# Patient Record
Sex: Female | Born: 1990
Health system: Southern US, Community
[De-identification: ages and names within clinical notes are randomized; demographics above are authoritative.]

## PROBLEM LIST (undated history)

## (undated) ENCOUNTER — Inpatient Hospital Stay (HOSPITAL_COMMUNITY): Payer: Self-pay

## (undated) DIAGNOSIS — I10 Essential (primary) hypertension: Secondary | ICD-10-CM

## (undated) DIAGNOSIS — R8271 Bacteriuria: Secondary | ICD-10-CM

## (undated) DIAGNOSIS — O9989 Other specified diseases and conditions complicating pregnancy, childbirth and the puerperium: Secondary | ICD-10-CM

## (undated) HISTORY — DX: Other specified diseases and conditions complicating pregnancy, childbirth and the puerperium: O99.89

## (undated) HISTORY — DX: Bacteriuria: R82.71

## (undated) HISTORY — PX: TYMPANOPLASTY: SHX33

---
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
## 2008-06-14 ENCOUNTER — Emergency Department: Payer: Self-pay | Admitting: Emergency Medicine

## 2012-12-14 ENCOUNTER — Encounter: Payer: Self-pay | Admitting: *Deleted

## 2012-12-14 ENCOUNTER — Ambulatory Visit (INDEPENDENT_AMBULATORY_CARE_PROVIDER_SITE_OTHER): Payer: Medicaid Other | Admitting: *Deleted

## 2012-12-14 VITALS — BP 129/88 | Ht <= 58 in | Wt 123.0 lb

## 2012-12-14 DIAGNOSIS — Z3400 Encounter for supervision of normal first pregnancy, unspecified trimester: Secondary | ICD-10-CM

## 2012-12-14 DIAGNOSIS — N912 Amenorrhea, unspecified: Secondary | ICD-10-CM

## 2012-12-14 DIAGNOSIS — Z3401 Encounter for supervision of normal first pregnancy, first trimester: Secondary | ICD-10-CM

## 2012-12-14 NOTE — Progress Notes (Signed)
Patient is here today for her New OB nurse visit.  She is doing well and is only [redacted] weeks along today.  She will return to the office in 3 weeks for bloodwork coand an ultrasound to confirm fetal viability.  She is currently taking prenatal vitamins and will continue with this.  She will see the physician at her next visit.

## 2012-12-14 NOTE — Patient Instructions (Signed)
Pregnancy - First Trimester  During sexual intercourse, millions of sperm go into the vagina. Only 1 sperm will penetrate and fertilize the female egg while it is in the Fallopian tube. One week later, the fertilized egg implants into the wall of the uterus. An embryo begins to develop into a baby. At 6 to 8 weeks, the eyes and face are formed and the heartbeat can be seen on ultrasound. At the end of 12 weeks (first trimester), all the baby's organs are formed. Now that you are pregnant, you will want to do everything you can to have a healthy baby. Two of the most important things are to get good prenatal care and follow your caregiver's instructions. Prenatal care is all the medical care you receive before the baby's birth. It is given to prevent, find, and treat problems during the pregnancy and childbirth.  PRENATAL EXAMS  · During prenatal visits, your weight, blood pressure, and urine are checked. This is done to make sure you are healthy and progressing normally during the pregnancy.  · A pregnant woman should gain 25 to 35 pounds during the pregnancy. However, if you are overweight or underweight, your caregiver will advise you regarding your weight.  · Your caregiver will ask and answer questions for you.  · Blood work, cervical cultures, other necessary tests, and a Pap test are done during your prenatal exams. These tests are done to check on your health and the probable health of your baby. Tests are strongly recommended and done for HIV with your permission. This is the virus that causes AIDS. These tests are done because medicines can be given to help prevent your baby from being born with this infection should you have been infected without knowing it. Blood work is also used to find out your blood type, previous infections, and follow your blood levels (hemoglobin).  · Low hemoglobin (anemia) is common during pregnancy. Iron and vitamins are given to help prevent this. Later in the pregnancy, blood  tests for diabetes will be done along with any other tests if any problems develop.  · You may need other tests to make sure you and the baby are doing well.  CHANGES DURING THE FIRST TRIMESTER   Your body goes through many changes during pregnancy. They vary from person to person. Talk to your caregiver about changes you notice and are concerned about. Changes can include:  · Your menstrual period stops.  · The egg and sperm carry the genes that determine what you look like. Genes from you and your partner are forming a baby. The female genes determine whether the baby is a boy or a girl.  · Your body increases in girth and you may feel bloated.  · Feeling sick to your stomach (nauseous) and throwing up (vomiting). If the vomiting is uncontrollable, call your caregiver.  · Your breasts will begin to enlarge and become tender.  · Your nipples may stick out more and become darker.  · The need to urinate more. Painful urination may mean you have a bladder infection.  · Tiring easily.  · Loss of appetite.  · Cravings for certain kinds of food.  · At first, you may gain or lose a couple of pounds.  · You may have changes in your emotions from day to day (excited to be pregnant or concerned something may go wrong with the pregnancy and baby).  · You may have more vivid and strange dreams.  HOME CARE INSTRUCTIONS   ·   It is very important to avoid all smoking, alcohol and non-prescribed drugs during your pregnancy. These affect the formation and growth of the baby. Avoid chemicals while pregnant to ensure the delivery of a healthy infant.  · Start your prenatal visits by the 12th week of pregnancy. They are usually scheduled monthly at first, then more often in the last 2 months before delivery. Keep your caregiver's appointments. Follow your caregiver's instructions regarding medicine use, blood and lab tests, exercise, and diet.  · During pregnancy, you are providing food for you and your baby. Eat regular, well-balanced  meals. Choose foods such as meat, fish, milk and other low fat dairy products, vegetables, fruits, and whole-grain breads and cereals. Your caregiver will tell you of the ideal weight gain.  · You can help morning sickness by keeping soda crackers at the bedside. Eat a couple before arising in the morning. You may want to use the crackers without salt on them.  · Eating 4 to 5 small meals rather than 3 large meals a day also may help the nausea and vomiting.  · Drinking liquids between meals instead of during meals also seems to help nausea and vomiting.  · A physical sexual relationship may be continued throughout pregnancy if there are no other problems. Problems may be early (premature) leaking of amniotic fluid from the membranes, vaginal bleeding, or belly (abdominal) pain.  · Exercise regularly if there are no restrictions. Check with your caregiver or physical therapist if you are unsure of the safety of some of your exercises. Greater weight gain will occur in the last 2 trimesters of pregnancy. Exercising will help:  · Control your weight.  · Keep you in shape.  · Prepare you for labor and delivery.  · Help you lose your pregnancy weight after you deliver your baby.  · Wear a good support or jogging bra for breast tenderness during pregnancy. This may help if worn during sleep too.  · Ask when prenatal classes are available. Begin classes when they are offered.  · Do not use hot tubs, steam rooms, or saunas.  · Wear your seat belt when driving. This protects you and your baby if you are in an accident.  · Avoid raw meat, uncooked cheese, cat litter boxes, and soil used by cats throughout the pregnancy. These carry germs that can cause birth defects in the baby.  · The first trimester is a good time to visit your dentist for your dental health. Getting your teeth cleaned is okay. Use a softer toothbrush and brush gently during pregnancy.  · Ask for help if you have financial, counseling, or nutritional needs  during pregnancy. Your caregiver will be able to offer counseling for these needs as well as refer you for other special needs.  · Do not take any medicines or herbs unless told by your caregiver.  · Inform your caregiver if there is any mental or physical domestic violence.  · Make a list of emergency phone numbers of family, friends, hospital, and police and fire departments.  · Write down your questions. Take them to your prenatal visit.  · Do not douche.  · Do not cross your legs.  · If you have to stand for long periods of time, rotate you feet or take small steps in a circle.  · You may have more vaginal secretions that may require a sanitary pad. Do not use tampons or scented sanitary pads.  MEDICINES AND DRUG USE IN PREGNANCY  ·   Take prenatal vitamins as directed. The vitamin should contain 1 milligram of folic acid. Keep all vitamins out of reach of children. Only a couple vitamins or tablets containing iron may be fatal to a baby or young child when ingested.  · Avoid use of all medicines, including herbs, over-the-counter medicines, not prescribed or suggested by your caregiver. Only take over-the-counter or prescription medicines for pain, discomfort, or fever as directed by your caregiver. Do not use aspirin, ibuprofen, or naproxen unless directed by your caregiver.  · Let your caregiver also know about herbs you may be using.  · Alcohol is related to a number of birth defects. This includes fetal alcohol syndrome. All alcohol, in any form, should be avoided completely. Smoking will cause low birth rate and premature babies.  · Street or illegal drugs are very harmful to the baby. They are absolutely forbidden. A baby born to an addicted mother will be addicted at birth. The baby will go through the same withdrawal an adult does.  · Let your caregiver know about any medicines that you have to take and for what reason you take them.  SEEK MEDICAL CARE IF:   You have any concerns or worries during your  pregnancy. It is better to call with your questions if you feel they cannot wait, rather than worry about them.  SEEK IMMEDIATE MEDICAL CARE IF:   · An unexplained oral temperature above 102° F (38.9° C) develops, or as your caregiver suggests.  · You have leaking of fluid from the vagina (birth canal). If leaking membranes are suspected, take your temperature and inform your caregiver of this when you call.  · There is vaginal spotting or bleeding. Notify your caregiver of the amount and how many pads are used.  · You develop a bad smelling vaginal discharge with a change in the color.  · You continue to feel sick to your stomach (nauseated) and have no relief from remedies suggested. You vomit blood or coffee ground-like materials.  · You lose more than 2 pounds of weight in 1 week.  · You gain more than 2 pounds of weight in 1 week and you notice swelling of your face, hands, feet, or legs.  · You gain 5 pounds or more in 1 week (even if you do not have swelling of your hands, face, legs, or feet).  · You get exposed to German measles and have never had them.  · You are exposed to fifth disease or chickenpox.  · You develop belly (abdominal) pain. Round ligament discomfort is a common non-cancerous (benign) cause of abdominal pain in pregnancy. Your caregiver still must evaluate this.  · You develop headache, fever, diarrhea, pain with urination, or shortness of breath.  · You fall or are in a car accident or have any kind of trauma.  · There is mental or physical violence in your home.  Document Released: 03/17/2001 Document Revised: 12/16/2011 Document Reviewed: 09/18/2008  [employer]® Patient Information ©2014 [employer].

## 2013-01-04 ENCOUNTER — Ambulatory Visit (INDEPENDENT_AMBULATORY_CARE_PROVIDER_SITE_OTHER): Payer: Medicaid Other | Admitting: Obstetrics and Gynecology

## 2013-01-04 ENCOUNTER — Other Ambulatory Visit (HOSPITAL_COMMUNITY)
Admission: RE | Admit: 2013-01-04 | Discharge: 2013-01-04 | Disposition: A | Discharge status: Home / Self Care | Payer: Medicaid Other | Source: Ambulatory Visit | Attending: Obstetrics and Gynecology | Admitting: Obstetrics and Gynecology

## 2013-01-04 ENCOUNTER — Encounter: Payer: Self-pay | Admitting: Obstetrics and Gynecology

## 2013-01-04 VITALS — BP 94/85 | Wt 122.0 lb

## 2013-01-04 DIAGNOSIS — Z3400 Encounter for supervision of normal first pregnancy, unspecified trimester: Secondary | ICD-10-CM

## 2013-01-04 DIAGNOSIS — Z3401 Encounter for supervision of normal first pregnancy, first trimester: Secondary | ICD-10-CM

## 2013-01-04 DIAGNOSIS — Z01419 Encounter for gynecological examination (general) (routine) without abnormal findings: Secondary | ICD-10-CM | POA: Insufficient documentation

## 2013-01-04 DIAGNOSIS — Z113 Encounter for screening for infections with a predominantly sexual mode of transmission: Secondary | ICD-10-CM | POA: Insufficient documentation

## 2013-01-04 NOTE — Patient Instructions (Signed)
Pregnancy - First Trimester During sexual intercourse, millions of sperm go into the vagina. Only 1 sperm will penetrate and fertilize the female egg while it is in the Fallopian tube. One week later, the fertilized egg implants into the wall of the uterus. An embryo begins to develop into a baby. At 6 to 8 weeks, the eyes and face are formed and the heartbeat can be seen on ultrasound. At the end of 12 weeks (first trimester), all the baby's organs are formed. Now that you are pregnant, you will want to do everything you can to have a healthy baby. Two of the most important things are to get good prenatal care and follow your caregiver's instructions. Prenatal care is all the medical care you receive before the baby's birth. It is given to prevent, find, and treat problems during the pregnancy and childbirth. PRENATAL EXAMS  During prenatal visits, your weight, blood pressure, and urine are checked. This is done to make sure you are healthy and progressing normally during the pregnancy.  A pregnant woman should gain 25 to 35 pounds during the pregnancy. However, if you are overweight or underweight, your caregiver will advise you regarding your weight.  Your caregiver will ask and answer questions for you.  Blood work, cervical cultures, other necessary tests, and a Pap test are done during your prenatal exams. These tests are done to check on your health and the probable health of your baby. Tests are strongly recommended and done for HIV with your permission. This is the virus that causes AIDS. These tests are done because medicines can be given to help prevent your baby from being born with this infection should you have been infected without knowing it. Blood work is also used to find out your blood type, previous infections, and follow your blood levels (hemoglobin).  Low hemoglobin (anemia) is common during pregnancy. Iron and vitamins are given to help prevent this. Later in the pregnancy,  blood tests for diabetes will be done along with any other tests if any problems develop.  You may need other tests to make sure you and the baby are doing well. CHANGES DURING THE FIRST TRIMESTER  Your body goes through many changes during pregnancy. They vary from person to person. Talk to your caregiver about changes you notice and are concerned about. Changes can include:  Your menstrual period stops.  The egg and sperm carry the genes that determine what you look like. Genes from you and your partner are forming a baby. The female genes determine whether the baby is a boy or a girl.  Your body increases in girth and you may feel bloated.  Feeling sick to your stomach (nauseous) and throwing up (vomiting). If the vomiting is uncontrollable, call your caregiver.  Your breasts will begin to enlarge and become tender.  Your nipples may stick out more and become darker.  The need to urinate more. Painful urination may mean you have a bladder infection.  Tiring easily.  Loss of appetite.  Cravings for certain kinds of food.  At first, you may gain or lose a couple of pounds.  You may have changes in your emotions from day to day (excited to be pregnant or concerned something may go wrong with the pregnancy and baby).  You may have more vivid and strange dreams. HOME CARE INSTRUCTIONS   It is very important to avoid all smoking, alcohol and non-prescribed drugs during your pregnancy. These affect the formation and growth of the baby.   Avoid chemicals while pregnant to ensure the delivery of a healthy infant.  Start your prenatal visits by the 12th week of pregnancy. They are usually scheduled monthly at first, then more often in the last 2 months before delivery. Keep your caregiver's appointments. Follow your caregiver's instructions regarding medicine use, blood and lab tests, exercise, and diet.  During pregnancy, you are providing food for you and your baby. Eat regular,  well-balanced meals. Choose foods such as meat, fish, milk and other low fat dairy products, vegetables, fruits, and whole-grain breads and cereals. Your caregiver will tell you of the ideal weight gain.  You can help morning sickness by keeping soda crackers at the bedside. Eat a couple before arising in the morning. You may want to use the crackers without salt on them.  Eating 4 to 5 small meals rather than 3 large meals a day also may help the nausea and vomiting.  Drinking liquids between meals instead of during meals also seems to help nausea and vomiting.  A physical sexual relationship may be continued throughout pregnancy if there are no other problems. Problems may be early (premature) leaking of amniotic fluid from the membranes, vaginal bleeding, or belly (abdominal) pain.  Exercise regularly if there are no restrictions. Check with your caregiver or physical therapist if you are unsure of the safety of some of your exercises. Greater weight gain will occur in the last 2 trimesters of pregnancy. Exercising will help:  Control your weight.  Keep you in shape.  Prepare you for labor and delivery.  Help you lose your pregnancy weight after you deliver your baby.  Wear a good support or jogging bra for breast tenderness during pregnancy. This may help if worn during sleep too.  Ask when prenatal classes are available. Begin classes when they are offered.  Do not use hot tubs, steam rooms, or saunas.  Wear your seat belt when driving. This protects you and your baby if you are in an accident.  Avoid raw meat, uncooked cheese, cat litter boxes, and soil used by cats throughout the pregnancy. These carry germs that can cause birth defects in the baby.  The first trimester is a good time to visit your dentist for your dental health. Getting your teeth cleaned is okay. Use a softer toothbrush and brush gently during pregnancy.  Ask for help if you have financial, counseling, or  nutritional needs during pregnancy. Your caregiver will be able to offer counseling for these needs as well as refer you for other special needs.  Do not take any medicines or herbs unless told by your caregiver.  Inform your caregiver if there is any mental or physical domestic violence.  Make a list of emergency phone numbers of family, friends, hospital, and police and fire departments.  Write down your questions. Take them to your prenatal visit.  Do not douche.  Do not cross your legs.  If you have to stand for long periods of time, rotate you feet or take small steps in a circle.  You may have more vaginal secretions that may require a sanitary pad. Do not use tampons or scented sanitary pads. MEDICINES AND DRUG USE IN PREGNANCY  Take prenatal vitamins as directed. The vitamin should contain 1 milligram of folic acid. Keep all vitamins out of reach of children. Only a couple vitamins or tablets containing iron may be fatal to a baby or young child when ingested.  Avoid use of all medicines, including herbs, over-the-counter medicines, not   prescribed or suggested by your caregiver. Only take over-the-counter or prescription medicines for pain, discomfort, or fever as directed by your caregiver. Do not use aspirin, ibuprofen, or naproxen unless directed by your caregiver.  Let your caregiver also know about herbs you may be using.  Alcohol is related to a number of birth defects. This includes fetal alcohol syndrome. All alcohol, in any form, should be avoided completely. Smoking will cause low birth rate and premature babies.  Street or illegal drugs are very harmful to the baby. They are absolutely forbidden. A baby born to an addicted mother will be addicted at birth. The baby will go through the same withdrawal an adult does.  Let your caregiver know about any medicines that you have to take and for what reason you take them. SEEK MEDICAL CARE IF:  You have any concerns or  worries during your pregnancy. It is better to call with your questions if you feel they cannot wait, rather than worry about them. SEEK IMMEDIATE MEDICAL CARE IF:   An unexplained oral temperature above 102 F (38.9 C) develops, or as your caregiver suggests.  You have leaking of fluid from the vagina (birth canal). If leaking membranes are suspected, take your temperature and inform your caregiver of this when you call.  There is vaginal spotting or bleeding. Notify your caregiver of the amount and how many pads are used.  You develop a bad smelling vaginal discharge with a change in the color.  You continue to feel sick to your stomach (nauseated) and have no relief from remedies suggested. You vomit blood or coffee ground-like materials.  You lose more than 2 pounds of weight in 1 week.  You gain more than 2 pounds of weight in 1 week and you notice swelling of your face, hands, feet, or legs.  You gain 5 pounds or more in 1 week (even if you do not have swelling of your hands, face, legs, or feet).  You get exposed to German measles and have never had them.  You are exposed to fifth disease or chickenpox.  You develop belly (abdominal) pain. Round ligament discomfort is a common non-cancerous (benign) cause of abdominal pain in pregnancy. Your caregiver still must evaluate this.  You develop headache, fever, diarrhea, pain with urination, or shortness of breath.  You fall or are in a car accident or have any kind of trauma.  There is mental or physical violence in your home. Document Released: 03/17/2001 Document Revised: 12/16/2011 Document Reviewed: 09/18/2008 [employer] Patient Information 2014 [employer].  Contraception Choices Contraception (birth control) is the use of any methods or devices to prevent pregnancy. Below are some methods to help avoid pregnancy. HORMONAL METHODS   Contraceptive implant. This is a thin, plastic tube containing progesterone hormone. It  does not contain estrogen hormone. Your caregiver inserts the tube in the inner part of the upper arm. The tube can remain in place for up to 3 years. After 3 years, the implant must be removed. The implant prevents the ovaries from releasing an egg (ovulation), thickens the cervical mucus which prevents sperm from entering the uterus, and thins the lining of the inside of the uterus.  Progesterone-only injections. These injections are given every 3 months by your caregiver to prevent pregnancy. This synthetic progesterone hormone stops the ovaries from releasing eggs. It also thickens cervical mucus and changes the uterine lining. This makes it harder for sperm to survive in the uterus.  Birth control pills. These pills   contain estrogen and progesterone hormone. They work by stopping the egg from forming in the ovary (ovulation). Birth control pills are prescribed by a caregiver.Birth control pills can also be used to treat heavy periods.  Minipill. This type of birth control pill contains only the progesterone hormone. They are taken every day of each month and must be prescribed by your caregiver.  Birth control patch. The patch contains hormones similar to those in birth control pills. It must be changed once a week and is prescribed by a caregiver.  Vaginal ring. The ring contains hormones similar to those in birth control pills. It is left in the vagina for 3 weeks, removed for 1 week, and then a new one is put back in place. The patient must be comfortable inserting and removing the ring from the vagina.A caregiver's prescription is necessary.  Emergency contraception. Emergency contraceptives prevent pregnancy after unprotected sexual intercourse. This pill can be taken right after sex or up to 5 days after unprotected sex. It is most effective the sooner you take the pills after having sexual intercourse. Emergency contraceptive pills are available without a prescription. Check with your  pharmacist. Do not use emergency contraception as your only form of birth control. BARRIER METHODS   Female condom. This is a thin sheath (latex or rubber) that is worn over the penis during sexual intercourse. It can be used with spermicide to increase effectiveness.  Female condom. This is a soft, loose-fitting sheath that is put into the vagina before sexual intercourse.  Diaphragm. This is a soft, latex, dome-shaped barrier that must be fitted by a caregiver. It is inserted into the vagina, along with a spermicidal jelly. It is inserted before intercourse. The diaphragm should be left in the vagina for 6 to 8 hours after intercourse.  Cervical cap. This is a round, soft, latex or plastic cup that fits over the cervix and must be fitted by a caregiver. The cap can be left in place for up to 48 hours after intercourse.  Sponge. This is a soft, circular piece of polyurethane foam. The sponge has spermicide in it. It is inserted into the vagina after wetting it and before sexual intercourse.  Spermicides. These are chemicals that kill or block sperm from entering the cervix and uterus. They come in the form of creams, jellies, suppositories, foam, or tablets. They do not require a prescription. They are inserted into the vagina with an applicator before having sexual intercourse. The process must be repeated every time you have sexual intercourse. INTRAUTERINE CONTRACEPTION  Intrauterine device (IUD). This is a T-shaped device that is put in a woman's uterus during a menstrual period to prevent pregnancy. There are 2 types:  Copper IUD. This type of IUD is wrapped in copper wire and is placed inside the uterus. Copper makes the uterus and fallopian tubes produce a fluid that kills sperm. It can stay in place for 10 years.  Hormone IUD. This type of IUD contains the hormone progestin (synthetic progesterone). The hormone thickens the cervical mucus and prevents sperm from entering the uterus, and it  also thins the uterine lining to prevent implantation of a fertilized egg. The hormone can weaken or kill the sperm that get into the uterus. It can stay in place for 5 years. PERMANENT METHODS OF CONTRACEPTION  Female tubal ligation. This is when the woman's fallopian tubes are surgically sealed, tied, or blocked to prevent the egg from traveling to the uterus.  Female sterilization. This is when   the female has the tubes that carry sperm tied off (vasectomy).This blocks sperm from entering the vagina during sexual intercourse. After the procedure, the man can still ejaculate fluid (semen). NATURAL PLANNING METHODS  Natural family planning. This is not having sexual intercourse or using a barrier method (condom, diaphragm, cervical cap) on days the woman could become pregnant.  Calendar method. This is keeping track of the length of each menstrual cycle and identifying when you are fertile.  Ovulation method. This is avoiding sexual intercourse during ovulation.  Symptothermal method. This is avoiding sexual intercourse during ovulation, using a thermometer and ovulation symptoms.  Post-ovulation method. This is timing sexual intercourse after you have ovulated. Regardless of which type or method of contraception you choose, it is important that you use condoms to protect against the transmission of sexually transmitted diseases (STDs). Talk with your caregiver about which form of contraception is most appropriate for you. Document Released: 03/23/2005 Document Revised: 06/15/2011 Document Reviewed: 07/30/2010 [employer] Patient Information 2014 [employer].  Breastfeeding A change in hormones during your pregnancy causes growth of your breast tissue and an increase in number and size of milk ducts. The hormone prolactin allows proteins, sugars, and fats from your blood supply to make breast milk in your milk-producing glands. The hormone progesterone prevents breast milk from being released  before the birth of your baby. After the birth of your baby, your progesterone level decreases allowing breast milk to be released. Thoughts of your baby, as well as his or her sucking or crying, can stimulate the release of milk from the milk-producing glands. Deciding to breastfeed (nurse) is one of the best choices you can make for you and your baby. The information that follows gives a brief review of the benefits, as well as other important skills to know about breastfeeding. BENEFITS OF BREASTFEEDING For your baby  The first milk (colostrum) helps your baby's digestive system function better.   There are antibodies in your milk that help your baby fight off infections.   Your baby has a lower incidence of asthma, allergies, and sudden infant death syndrome (SIDS).   The nutrients in breast milk are better for your baby than infant formulas.  Breast milk improves your baby's brain development.   Your baby will have less gas, colic, and constipation.  Your baby is less likely to develop other conditions, such as childhood obesity, asthma, or diabetes mellitus. For you  Breastfeeding helps develop a very special bond between you and your baby.   Breastfeeding is convenient, always available at the correct temperature, and costs nothing.   Breastfeeding helps to burn calories and helps you lose the weight gained during pregnancy.   Breastfeeding makes your uterus contract back down to normal size faster and slows bleeding following delivery.   Breastfeeding mothers have a lower risk of developing osteoporosis or breast or ovarian cancer later in life.  BREASTFEEDING FREQUENCY  A healthy, full-term baby may breastfeed as often as every hour or space his or her feedings to every 3 hours. Breastfeeding frequency will vary from baby to baby.   Newborns should be fed no less than every 2 3 hours during the day and every 4 5 hours during the night. You should breastfeed a  minimum of 8 feedings in a 24 hour period.  Awaken your baby to breastfeed if it has been 3 4 hours since the last feeding.  Breastfeed when you feel the need to reduce the fullness of your breasts or when   your newborn shows signs of hunger. Signs that your baby may be hungry include:  Increased alertness or activity.  Stretching.  Movement of the head from side to side.  Movement of the head and opening of the mouth when the corner of the mouth or cheek is stroked (rooting).  Increased sucking sounds, smacking lips, cooing, sighing, or squeaking.  Hand-to-mouth movements.  Increased sucking of fingers or hands.  Fussing.  Intermittent crying.  Signs of extreme hunger will require calming and consoling before you try to feed your baby. Signs of extreme hunger may include:  Restlessness.  A loud, strong cry.  Screaming.  Frequent feeding will help you make more milk and will help prevent problems, such as sore nipples and engorgement of the breasts.  BREASTFEEDING   Whether lying down or sitting, be sure that the baby's abdomen is facing your abdomen.   Support your breast with 4 fingers under your breast and your thumb above your nipple. Make sure your fingers are well away from your nipple and your baby's mouth.   Stroke your baby's lips gently with your finger or nipple.   When your baby's mouth is open wide enough, place all of your nipple and as much of the colored area around your nipple (areola) as possible into your baby's mouth.  More areola should be visible above his or her upper lip than below his or her lower lip.  Your baby's tongue should be between his or her lower gum and your breast.  Ensure that your baby's mouth is correctly positioned around the nipple (latched). Your baby's lips should create a seal on your breast.  Signs that your baby has effectively latched onto your nipple include:  Tugging or sucking without pain.  Swallowing heard  between sucks.  Absent click or smacking sound.  Muscle movement above and in front of his or her ears with sucking.  Your baby must suck about 2 3 minutes in order to get your milk. Allow your baby to feed on each breast as long as he or she wants. Nurse your baby until he or she unlatches or falls asleep at the first breast, then offer the second breast.  Signs that your baby is full and satisfied include:  A gradual decrease in the number of sucks or complete cessation of sucking.  Falling asleep.  Extension or relaxation of his or her body.  Retention of a small amount of milk in his or her mouth.  Letting go of your breast by himself or herself.  Signs of effective breastfeeding in you include:  Breasts that have increased firmness, weight, and size prior to feeding.  Breasts that are softer after nursing.  Increased milk volume, as well as a change in milk consistency and color by the 5th day of breastfeeding.  Breast fullness relieved by breastfeeding.  Nipples are not sore, cracked, or bleeding.  If needed, break the suction by putting your finger into the corner of your baby's mouth and sliding your finger between his or her gums. Then, remove your breast from his or her mouth.  It is common for babies to spit up a small amount after a feeding.  Babies often swallow air during feeding. This can make babies fussy. Burping your baby between breasts can help with this.  Vitamin D supplements are recommended for babies who get only breast milk.  Avoid using a pacifier during your baby's first 4 6 weeks.  Avoid supplemental feedings of water, formula, or   juice in place of breastfeeding. Breast milk is all the food your baby needs. It is not necessary for your baby to have water or formula. Your breasts will make more milk if supplemental feedings are avoided during the early weeks. HOW TO TELL WHETHER YOUR BABY IS GETTING ENOUGH BREAST MILK Wondering whether or not  your baby is getting enough milk is a common concern among mothers. You can be assured that your baby is getting enough milk if:   Your baby is actively sucking and you hear swallowing.   Your baby seems relaxed and satisfied after a feeding.   Your baby nurses at least 8 12 times in a 24 hour time period.  During the first 3 5 days of age:  Your baby is wetting at least 3 5 diapers in a 24 hour period. The urine should be clear and pale yellow.  Your baby is having at least 3 4 stools in a 24 hour period. The stool should be soft and yellow.  At 5 7 days of age, your baby is having at least 3 6 stools in a 24 hour period. The stool should be seedy and yellow by 5 days of age.  Your baby has a weight loss less than 7 10% during the first 3 days of age.  Your baby does not lose weight after 3 7 days of age.  Your baby gains 4 7 ounces each week after he or she is 4 days of age.  Your baby gains weight by 5 days of age and is back to birth weight within 2 weeks. ENGORGEMENT In the first week after your baby is born, you may experience extremely full breasts (engorgement). When engorged, your breasts may feel heavy, warm, or tender to the touch. Engorgement peaks within 24 48 hours after delivery of your baby.  Engorgement may be reduced by:  Continuing to breastfeed.  Increasing the frequency of breastfeeding.  Taking warm showers or applying warm, moist heat to your breasts just before each feeding. This increases circulation and helps the milk flow.   Gently massaging your breast before and during the feedings. With your fingertips, massage from your chest wall towards your nipple in a circular motion.   Ensuring that your baby empties at least one breast at every feeding. It also helps to start the next feeding on the opposite breast.   Expressing breast milk by hand or by using a breast pump to empty the breasts if your baby is sleepy, or not nursing well. You may also  want to express milk if you are returning to work oryou feel you are getting engorged.  Ensuring your baby is latched on and positioned properly while breastfeeding. If you follow these suggestions, your engorgement should improve in 24 48 hours. If you are still experiencing difficulty, call your lactation consultant or caregiver.  CARING FOR YOURSELF Take care of your breasts.  Bathe or shower daily.   Avoid using soap on your nipples.   Wear a supportive bra. Avoid wearing underwire style bras.  Air dry your nipples for a 3 4minutes after each feeding.   Use only cotton bra pads to absorb breast milk leakage. Leaking of breast milk between feedings is normal.   Use only pure lanolin on your nipples after nursing. You do not need to wash it off before feeding your baby again. Another option is to express a few drops of breast milk and gently massage that milk into your nipples.  Continue   breast self-awareness checks. Take care of yourself.  Eat healthy foods. Alternate 3 meals with 3 snacks.  Avoid foods that you notice affect your baby in a bad way.  Drink milk, fruit juice, and water to satisfy your thirst (about 8 glasses a day).   Rest often, relax, and take your prenatal vitamins to prevent fatigue, stress, and anemia.  Avoid chewing and smoking tobacco.  Avoid alcohol and drug use.  Take over-the-counter and prescribed medicine only as directed by your caregiver or pharmacist. You should always check with your caregiver or pharmacist before taking any new medicine, vitamin, or herbal supplement.  Know that pregnancy is possible while breastfeeding. If desired, talk to your caregiver about family planning and safe birth control methods that may be used while breastfeeding. SEEK MEDICAL CARE IF:   You feel like you want to stop breastfeeding or have become frustrated with breastfeeding.  You have painful breasts or nipples.  Your nipples are cracked or  bleeding.  Your breasts are red, tender, or warm.  You have a swollen area on either breast.  You have a fever or chills.  You have nausea or vomiting.  You have drainage from your nipples.  Your breasts do not become full before feedings by the 5th day after delivery.  You feel sad and depressed.  Your baby is too sleepy to eat well.  Your baby is having trouble sleeping.   Your baby is wetting less than 3 diapers in a 24 hour period.  Your baby has less than 3 stools in a 24 hour period.  Your baby's skin or the white part of his or her eyes becomes more yellow.   Your baby is not gaining weight by 5 days of age. MAKE SURE YOU:   Understand these instructions.  Will watch your condition.  Will get help right away if you are not doing well or get worse. Document Released: 03/23/2005 Document Revised: 12/16/2011 Document Reviewed: 10/28/2011 [employer] Patient Information 2014 [employer].  

## 2013-01-04 NOTE — Progress Notes (Signed)
   Subjective:    Emily Mack is a G1P0 [redacted]w[redacted]d being seen today for her first obstetrical visit.  Her obstetrical history is significant for first pregnancy. Patient does intend to breast feed. Pregnancy history fully reviewed.  Patient reports some nausea and emesis but she is able to tolerate food.  Filed Vitals:   01/04/13 0902  BP: 94/85  Weight: 122 lb (55.339 kg)    HISTORY: OB History  Gravida Para Term Preterm AB SAB TAB Ectopic Multiple Living  1             # Outcome Date GA Lbr Len/2nd Weight Sex Delivery Anes PTL Lv  1 CUR              Past Medical History  Diagnosis Date  . Medical history non-contributory    Past Surgical History  Procedure Laterality Date  . Tympanoplasty      age 22   Family History  Problem Relation Age of Onset  . Diabetes Mother      Exam    Uterus:     Pelvic Exam:    Perineum: Normal Perineum   Vulva: normal   Vagina:  normal mucosa, normal discharge   pH:    Cervix: closed and long   Adnexa: normal adnexa and no mass, fullness, tenderness   Bony Pelvis: android  System: Breast:  normal appearance, no masses or tenderness   Skin: normal coloration and turgor, no rashes    Neurologic: oriented, no focal deficits   Extremities: normal strength, tone, and muscle mass   HEENT extra ocular movement intact   Mouth/Teeth mucous membranes moist, pharynx normal without lesions   Neck supple and no masses   Cardiovascular: regular rate and rhythm   Respiratory:  chest clear, no wheezing, crepitations, rhonchi, normal symmetric air entry   Abdomen: soft, non-tender; bowel sounds normal; no masses,  no organomegaly   Urinary:       Assessment:    Pregnancy: G1P0 Patient Active Problem List   Diagnosis Date Noted  . Supervision of normal first pregnancy 01/04/2013        Plan:     Initial labs drawn. Prenatal vitamins. Problem list reviewed and updated. Genetic Screening discussed First Screen: requested.  Ultrasound discussed; fetal survey: will be ordered at a later visit.  Follow up in 4 weeks. 50% of 30 min visit spent on counseling and coordination of care.     Jules Baty 01/04/2013

## 2013-01-04 NOTE — Progress Notes (Signed)
Some nausea & vomiting.

## 2013-02-01 ENCOUNTER — Ambulatory Visit (INDEPENDENT_AMBULATORY_CARE_PROVIDER_SITE_OTHER): Payer: Medicaid Other | Admitting: Obstetrics & Gynecology

## 2013-02-01 ENCOUNTER — Encounter: Payer: Self-pay | Admitting: Obstetrics & Gynecology

## 2013-02-01 ENCOUNTER — Other Ambulatory Visit: Payer: Self-pay | Admitting: Obstetrics & Gynecology

## 2013-02-01 VITALS — BP 117/86 | Wt 120.0 lb

## 2013-02-01 DIAGNOSIS — Z3682 Encounter for antenatal screening for nuchal translucency: Secondary | ICD-10-CM

## 2013-02-01 DIAGNOSIS — Z3400 Encounter for supervision of normal first pregnancy, unspecified trimester: Secondary | ICD-10-CM

## 2013-02-01 NOTE — Progress Notes (Signed)
P = 94 

## 2013-02-01 NOTE — Progress Notes (Signed)
Routine visit. No problems. She wants the first screen so I will order it today. Flu vaccine today.

## 2013-02-08 ENCOUNTER — Other Ambulatory Visit: Payer: Self-pay

## 2013-02-08 ENCOUNTER — Ambulatory Visit (HOSPITAL_COMMUNITY)
Admission: RE | Admit: 2013-02-08 | Discharge: 2013-02-08 | Disposition: A | Discharge status: Home / Self Care | Payer: Medicaid Other | Source: Ambulatory Visit | Attending: Obstetrics & Gynecology | Admitting: Obstetrics & Gynecology

## 2013-02-08 DIAGNOSIS — O3510X0 Maternal care for (suspected) chromosomal abnormality in fetus, unspecified, not applicable or unspecified: Secondary | ICD-10-CM | POA: Insufficient documentation

## 2013-02-08 DIAGNOSIS — Z3682 Encounter for antenatal screening for nuchal translucency: Secondary | ICD-10-CM

## 2013-02-08 DIAGNOSIS — O351XX0 Maternal care for (suspected) chromosomal abnormality in fetus, not applicable or unspecified: Secondary | ICD-10-CM | POA: Insufficient documentation

## 2013-02-08 DIAGNOSIS — Z3689 Encounter for other specified antenatal screening: Secondary | ICD-10-CM | POA: Insufficient documentation

## 2013-02-09 ENCOUNTER — Other Ambulatory Visit: Payer: Self-pay

## 2013-02-10 ENCOUNTER — Other Ambulatory Visit (HOSPITAL_COMMUNITY): Payer: Medicaid Other

## 2013-02-20 ENCOUNTER — Telehealth (HOSPITAL_COMMUNITY): Payer: Self-pay | Admitting: MS"

## 2013-02-20 NOTE — Telephone Encounter (Signed)
Patient called to inquire about results of first trimester screening. Patient identified by name and DOB. Reviewed that first trimester screening results were within normal range. We discussed the reduction in risks for fetal Down syndrome ( 1 in 9,079) and Trisomy 18/13 (less than 1 in 10,000). Also reviewed that this screen does not diagnose or rule out these conditions. All questions were answered to her satisfaction.   Emily Mack 02/20/2013 10:04 AM

## 2013-02-24 ENCOUNTER — Encounter: Payer: Self-pay | Admitting: *Deleted

## 2013-03-01 ENCOUNTER — Encounter: Payer: Self-pay | Admitting: Obstetrics and Gynecology

## 2013-03-01 ENCOUNTER — Ambulatory Visit (INDEPENDENT_AMBULATORY_CARE_PROVIDER_SITE_OTHER): Payer: Medicaid Other | Admitting: Obstetrics and Gynecology

## 2013-03-01 VITALS — BP 123/88 | Wt 124.0 lb

## 2013-03-01 DIAGNOSIS — Z3402 Encounter for supervision of normal first pregnancy, second trimester: Secondary | ICD-10-CM

## 2013-03-01 DIAGNOSIS — Z3400 Encounter for supervision of normal first pregnancy, unspecified trimester: Secondary | ICD-10-CM

## 2013-03-01 NOTE — Progress Notes (Signed)
P - 97 

## 2013-03-01 NOTE — Progress Notes (Signed)
Patient doing well without complaints. Anatomy ultrasound already scheduled.

## 2013-03-20 ENCOUNTER — Other Ambulatory Visit: Payer: Self-pay | Admitting: Obstetrics and Gynecology

## 2013-03-20 DIAGNOSIS — Z0489 Encounter for examination and observation for other specified reasons: Secondary | ICD-10-CM

## 2013-03-22 ENCOUNTER — Encounter (HOSPITAL_COMMUNITY): Payer: Self-pay

## 2013-03-22 ENCOUNTER — Other Ambulatory Visit: Payer: Self-pay | Admitting: Obstetrics and Gynecology

## 2013-03-22 ENCOUNTER — Ambulatory Visit (HOSPITAL_COMMUNITY)
Admission: RE | Admit: 2013-03-22 | Discharge: 2013-03-22 | Disposition: A | Discharge status: Home / Self Care | Payer: Medicaid Other | Source: Ambulatory Visit | Attending: Obstetrics and Gynecology | Admitting: Obstetrics and Gynecology

## 2013-03-22 DIAGNOSIS — Z0489 Encounter for examination and observation for other specified reasons: Secondary | ICD-10-CM

## 2013-03-22 DIAGNOSIS — Z3689 Encounter for other specified antenatal screening: Secondary | ICD-10-CM | POA: Diagnosis not present

## 2013-03-23 ENCOUNTER — Encounter: Payer: Self-pay | Admitting: Obstetrics and Gynecology

## 2013-03-29 ENCOUNTER — Ambulatory Visit (INDEPENDENT_AMBULATORY_CARE_PROVIDER_SITE_OTHER): Payer: Medicaid Other | Admitting: Family Medicine

## 2013-03-29 VITALS — BP 122/81 | Wt 130.0 lb

## 2013-03-29 DIAGNOSIS — Z3402 Encounter for supervision of normal first pregnancy, second trimester: Secondary | ICD-10-CM

## 2013-03-29 DIAGNOSIS — Z3400 Encounter for supervision of normal first pregnancy, unspecified trimester: Secondary | ICD-10-CM

## 2013-03-29 NOTE — Progress Notes (Signed)
Doing well--still considering flu shot AFP today U/S was WNL

## 2013-03-29 NOTE — Patient Instructions (Signed)
Second Trimester of Pregnancy The second trimester is from week 13 through week 28, months 4 through 6. The second trimester is often a time when you feel your best. Your body has also adjusted to being pregnant, and you begin to feel better physically. Usually, morning sickness has lessened or quit completely, you may have more energy, and you may have an increase in appetite. The second trimester is also a time when the fetus is growing rapidly. At the end of the sixth month, the fetus is about 9 inches long and weighs about 1 pounds. You will likely begin to feel the baby move (quickening) between 18 and 20 weeks of the pregnancy. BODY CHANGES Your body goes through many changes during pregnancy. The changes vary from woman to woman.   Your weight will continue to increase. You will notice your lower abdomen bulging out.  You may begin to get stretch marks on your hips, abdomen, and breasts.  You may develop headaches that can be relieved by medicines approved by your caregiver.  You may urinate more often because the fetus is pressing on your bladder.  You may develop or continue to have heartburn as a result of your pregnancy.  You may develop constipation because certain hormones are causing the muscles that push waste through your intestines to slow down.  You may develop hemorrhoids or swollen, bulging veins (varicose veins).  You may have back pain because of the weight gain and pregnancy hormones relaxing your joints between the bones in your pelvis and as a result of a shift in weight and the muscles that support your balance.  Your breasts will continue to grow and be tender.  Your gums may bleed and may be sensitive to brushing and flossing.  Dark spots or blotches (chloasma, mask of pregnancy) may develop on your face. This will likely fade after the baby is born.  A dark line from your belly button to the pubic area (linea nigra) may appear. This will likely fade after  the baby is born. WHAT TO EXPECT AT YOUR PRENATAL VISITS During a routine prenatal visit:  You will be weighed to make sure you and the fetus are growing normally.  Your blood pressure will be taken.  Your abdomen will be measured to track your baby's growth.  The fetal heartbeat will be listened to.  Any test results from the previous visit will be discussed. Your caregiver may ask you:  How you are feeling.  If you are feeling the baby move.  If you have had any abnormal symptoms, such as leaking fluid, bleeding, severe headaches, or abdominal cramping.  If you have any questions. Other tests that may be performed during your second trimester include:  Blood tests that check for:  Low iron levels (anemia).  Gestational diabetes (between 24 and 28 weeks).  Rh antibodies.  Urine tests to check for infections, diabetes, or protein in the urine.  An ultrasound to confirm the proper growth and development of the baby.  An amniocentesis to check for possible genetic problems.  Fetal screens for spina bifida and Down syndrome. HOME CARE INSTRUCTIONS   Avoid all smoking, herbs, alcohol, and unprescribed drugs. These chemicals affect the formation and growth of the baby.  Follow your caregiver's instructions regarding medicine use. There are medicines that are either safe or unsafe to take during pregnancy.  Exercise only as directed by your caregiver. Experiencing uterine cramps is a good sign to stop exercising.  Continue to eat regular,   healthy meals.  Wear a good support bra for breast tenderness.  Do not use hot tubs, steam rooms, or saunas.  Wear your seat belt at all times when driving.  Avoid raw meat, uncooked cheese, cat litter boxes, and soil used by cats. These carry germs that can cause birth defects in the baby.  Take your prenatal vitamins.  Try taking a stool softener (if your caregiver approves) if you develop constipation. Eat more high-fiber  foods, such as fresh vegetables or fruit and whole grains. Drink plenty of fluids to keep your urine clear or pale yellow.  Take warm sitz baths to soothe any pain or discomfort caused by hemorrhoids. Use hemorrhoid cream if your caregiver approves.  If you develop varicose veins, wear support hose. Elevate your feet for 15 minutes, 3 4 times a day. Limit salt in your diet.  Avoid heavy lifting, wear low heel shoes, and practice good posture.  Rest with your legs elevated if you have leg cramps or low back pain.  Visit your dentist if you have not gone yet during your pregnancy. Use a soft toothbrush to brush your teeth and be gentle when you floss.  A sexual relationship may be continued unless your caregiver directs you otherwise.  Continue to go to all your prenatal visits as directed by your caregiver. SEEK MEDICAL CARE IF:   You have dizziness.  You have mild pelvic cramps, pelvic pressure, or nagging pain in the abdominal area.  You have persistent nausea, vomiting, or diarrhea.  You have a bad smelling vaginal discharge.  You have pain with urination. SEEK IMMEDIATE MEDICAL CARE IF:   You have a fever.  You are leaking fluid from your vagina.  You have spotting or bleeding from your vagina.  You have severe abdominal cramping or pain.  You have rapid weight gain or loss.  You have shortness of breath with chest pain.  You notice sudden or extreme swelling of your face, hands, ankles, feet, or legs.  You have not felt your baby move in over an hour.  You have severe headaches that do not go away with medicine.  You have vision changes. Document Released: 03/17/2001 Document Revised: 11/23/2012 Document Reviewed: 05/24/2012 [employer] Patient Information 2014 [employer].  Breastfeeding Deciding to breastfeed is one of the best choices you can make for you and your baby. A change in hormones during pregnancy causes your breast tissue to grow and increases the  number and size of your milk ducts. These hormones also allow proteins, sugars, and fats from your blood supply to make breast milk in your milk-producing glands. Hormones prevent breast milk from being released before your baby is born as well as prompt milk flow after birth. Once breastfeeding has begun, thoughts of your baby, as well as his or her sucking or crying, can stimulate the release of milk from your milk-producing glands.  BENEFITS OF BREASTFEEDING For Your Baby  Your first milk (colostrum) helps your baby's digestive system function better.   There are antibodies in your milk that help your baby fight off infections.   Your baby has a lower incidence of asthma, allergies, and sudden infant death syndrome.   The nutrients in breast milk are better for your baby than infant formulas and are designed uniquely for your baby's needs.   Breast milk improves your baby's brain development.   Your baby is less likely to develop other conditions, such as childhood obesity, asthma, or type 2 diabetes mellitus.  For   You   Breastfeeding helps to create a very special bond between you and your baby.   Breastfeeding is convenient. Breast milk is always available at the correct temperature and costs nothing.   Breastfeeding helps to burn calories and helps you lose the weight gained during pregnancy.   Breastfeeding makes your uterus contract to its prepregnancy size faster and slows bleeding (lochia) after you give birth.   Breastfeeding helps to lower your risk of developing type 2 diabetes mellitus, osteoporosis, and breast or ovarian cancer later in life. SIGNS THAT YOUR BABY IS HUNGRY Early Signs of Hunger  Increased alertness or activity.  Stretching.  Movement of the head from side to side.  Movement of the head and opening of the mouth when the corner of the mouth or cheek is stroked (rooting).  Increased sucking sounds, smacking lips, cooing, sighing, or  squeaking.  Hand-to-mouth movements.  Increased sucking of fingers or hands. Late Signs of Hunger  Fussing.  Intermittent crying. Extreme Signs of Hunger Signs of extreme hunger will require calming and consoling before your baby will be able to breastfeed successfully. Do not wait for the following signs of extreme hunger to occur before you initiate breastfeeding:   Restlessness.  A loud, strong cry.   Screaming. BREASTFEEDING BASICS Breastfeeding Initiation  Find a comfortable place to sit or lie down, with your neck and back well supported.  Place a pillow or rolled up blanket under your baby to bring him or her to the level of your breast (if you are seated). Nursing pillows are specially designed to help support your arms and your baby while you breastfeed.  Make sure that your baby's abdomen is facing your abdomen.   Gently massage your breast. With your fingertips, massage from your chest wall toward your nipple in a circular motion. This encourages milk flow. You may need to continue this action during the feeding if your milk flows slowly.  Support your breast with 4 fingers underneath and your thumb above your nipple. Make sure your fingers are well away from your nipple and your baby's mouth.   Stroke your baby's lips gently with your finger or nipple.   When your baby's mouth is open wide enough, quickly bring your baby to your breast, placing your entire nipple and as much of the colored area around your nipple (areola) as possible into your baby's mouth.   More areola should be visible above your baby's upper lip than below the lower lip.   Your baby's tongue should be between his or her lower gum and your breast.   Ensure that your baby's mouth is correctly positioned around your nipple (latched). Your baby's lips should create a seal on your breast and be turned out (everted).  It is common for your baby to suck about 2 3 minutes in order to start the  flow of breast milk. Latching Teaching your baby how to latch on to your breast properly is very important. An improper latch can cause nipple pain and decreased milk supply for you and poor weight gain in your baby. Also, if your baby is not latched onto your nipple properly, he or she may swallow some air during feeding. This can make your baby fussy. Burping your baby when you switch breasts during the feeding can help to get rid of the air. However, teaching your baby to latch on properly is still the best way to prevent fussiness from swallowing air while breastfeeding. Signs that your baby has   successfully latched on to your nipple:    Silent tugging or silent sucking, without causing you pain.   Swallowing heard between every 3 4 sucks.    Muscle movement above and in front of his or her ears while sucking.  Signs that your baby has not successfully latched on to nipple:   Sucking sounds or smacking sounds from your baby while breastfeeding.  Nipple pain. If you think your baby has not latched on correctly, slip your finger into the corner of your baby's mouth to break the suction and place it between your baby's gums. Attempt breastfeeding initiation again. Signs of Successful Breastfeeding Signs from your baby:   A gradual decrease in the number of sucks or complete cessation of sucking.   Falling asleep.   Relaxation of his or her body.   Retention of a small amount of milk in his or her mouth.   Letting go of your breast by himself or herself. Signs from you:  Breasts that have increased in firmness, weight, and size 1 3 hours after feeding.   Breasts that are softer immediately after breastfeeding.  Increased milk volume, as well as a change in milk consistency and color by the 5th day of breastfeeding.   Nipples that are not sore, cracked, or bleeding. Signs That Your Baby is Getting Enough Milk  Wetting at least 3 diapers in a 24-hour period. The urine  should be clear and pale yellow by age 5 days.  At least 3 stools in a 24-hour period by age 5 days. The stool should be soft and yellow.  At least 3 stools in a 24-hour period by age 7 days. The stool should be seedy and yellow.  No loss of weight greater than 10% of birth weight during the first 3 days of age.  Average weight gain of 4 7 ounces (120 210 mL) per week after age 4 days.  Consistent daily weight gain by age 5 days, without weight loss after the age of 2 weeks. After a feeding, your baby may spit up a small amount. This is common. BREASTFEEDING FREQUENCY AND DURATION Frequent feeding will help you make more milk and can prevent sore nipples and breast engorgement. Breastfeed when you feel the need to reduce the fullness of your breasts or when your baby shows signs of hunger. This is called "breastfeeding on demand." Avoid introducing a pacifier to your baby while you are working to establish breastfeeding (the first 4 6 weeks after your baby is born). After this time you may choose to use a pacifier. Research has shown that pacifier use during the first year of a baby's life decreases the risk of sudden infant death syndrome (SIDS). Allow your baby to feed on each breast as long as he or she wants. Breastfeed until your baby is finished feeding. When your baby unlatches or falls asleep while feeding from the first breast, offer the second breast. Because newborns are often sleepy in the first few weeks of life, you may need to awaken your baby to get him or her to feed. Breastfeeding times will vary from baby to baby. However, the following rules can serve as a guide to help you ensure that your baby is properly fed:  Newborns (babies 4 weeks of age or younger) may breastfeed every 1 3 hours.  Newborns should not go longer than 3 hours during the day or 5 hours during the night without breastfeeding.  You should breastfeed your baby a minimum of   8 times in a 24-hour period until  you begin to introduce solid foods to your baby at around 6 months of age. BREAST MILK PUMPING Pumping and storing breast milk allows you to ensure that your baby is exclusively fed your breast milk, even at times when you are unable to breastfeed. This is especially important if you are going back to work while you are still breastfeeding or when you are not able to be present during feedings. Your lactation consultant can give you guidelines on how long it is safe to store breast milk.  A breast pump is a machine that allows you to pump milk from your breast into a sterile bottle. The pumped breast milk can then be stored in a refrigerator or freezer. Some breast pumps are operated by hand, while others use electricity. Ask your lactation consultant which type will work best for you. Breast pumps can be purchased, but some hospitals and breastfeeding support groups lease breast pumps on a monthly basis. A lactation consultant can teach you how to hand express breast milk, if you prefer not to use a pump.  CARING FOR YOUR BREASTS WHILE YOU BREASTFEED Nipples can become dry, cracked, and sore while breastfeeding. The following recommendations can help keep your breasts moisturized and healthy:  Avoid using soap on your nipples.   Wear a supportive bra. Although not required, special nursing bras and tank tops are designed to allow access to your breasts for breastfeeding without taking off your entire bra or top. Avoid wearing underwire style bras or extremely tight bras.  Air dry your nipples for 3 4minutes after each feeding.   Use only cotton bra pads to absorb leaked breast milk. Leaking of breast milk between feedings is normal.   Use lanolin on your nipples after breastfeeding. Lanolin helps to maintain your skin's normal moisture barrier. If you use pure lanolin you do not need to wash it off before feeding your baby again. Pure lanolin is not toxic to your baby. You may also hand express a  few drops of breast milk and gently massage that milk into your nipples and allow the milk to air dry. In the first few weeks after giving birth, some women experience extremely full breasts (engorgement). Engorgement can make your breasts feel heavy, warm, and tender to the touch. Engorgement peaks within 3 5 days after you give birth. The following recommendations can help ease engorgement:  Completely empty your breasts while breastfeeding or pumping. You may want to start by applying warm, moist heat (in the shower or with warm water-soaked hand towels) just before feeding or pumping. This increases circulation and helps the milk flow. If your baby does not completely empty your breasts while breastfeeding, pump any extra milk after he or she is finished.  Wear a snug bra (nursing or regular) or tank top for 1 2 days to signal your body to slightly decrease milk production.  Apply ice packs to your breasts, unless this is too uncomfortable for you.  Make sure that your baby is latched on and positioned properly while breastfeeding. If engorgement persists after 48 hours of following these recommendations, contact your health care provider or a lactation consultant. OVERALL HEALTH CARE RECOMMENDATIONS WHILE BREASTFEEDING  Eat healthy foods. Alternate between meals and snacks, eating 3 of each per day. Because what you eat affects your breast milk, some of the foods may make your baby more irritable than usual. Avoid eating these foods if you are sure that they are   negatively affecting your baby.  Drink milk, fruit juice, and water to satisfy your thirst (about 10 glasses a day).   Rest often, relax, and continue to take your prenatal vitamins to prevent fatigue, stress, and anemia.  Continue breast self-awareness checks.  Avoid chewing and smoking tobacco.  Avoid alcohol and drug use. Some medicines that may be harmful to your baby can pass through breast milk. It is important to ask your  health care provider before taking any medicine, including all over-the-counter and prescription medicine as well as vitamin and herbal supplements. It is possible to become pregnant while breastfeeding. If birth control is desired, ask your health care provider about options that will be safe for your baby. SEEK MEDICAL CARE IF:   You feel like you want to stop breastfeeding or have become frustrated with breastfeeding.  You have painful breasts or nipples.  Your nipples are cracked or bleeding.  Your breasts are red, tender, or warm.  You have a swollen area on either breast.  You have a fever or chills.  You have nausea or vomiting.  You have drainage other than breast milk from your nipples.  Your breasts do not become full before feedings by the 5th day after you give birth.  You feel sad and depressed.  Your baby is too sleepy to eat well.  Your baby is having trouble sleeping.   Your baby is wetting less than 3 diapers in a 24-hour period.  Your baby has less than 3 stools in a 24-hour period.  Your baby's skin or the white part of his or her eyes becomes yellow.   Your baby is not gaining weight by 5 days of age. SEEK IMMEDIATE MEDICAL CARE IF:   Your baby is overly tired (lethargic) and does not want to wake up and feed.  Your baby develops an unexplained fever. Document Released: 03/23/2005 Document Revised: 11/23/2012 Document Reviewed: 09/14/2012 [employer] Patient Information 2014 [employer].  

## 2013-03-29 NOTE — Progress Notes (Signed)
P-75 

## 2013-04-04 LAB — ALPHA FETOPROTEIN, MATERNAL
MoM for AFP: 0.81
Open Spina bifida: NEGATIVE
Osb Risk: 1:26100 {titer}

## 2013-04-05 ENCOUNTER — Encounter: Payer: Self-pay | Admitting: Family Medicine

## 2013-04-26 ENCOUNTER — Encounter: Payer: Self-pay | Admitting: Family Medicine

## 2013-04-26 ENCOUNTER — Ambulatory Visit (INDEPENDENT_AMBULATORY_CARE_PROVIDER_SITE_OTHER): Payer: Medicaid Other | Admitting: Family Medicine

## 2013-04-26 VITALS — BP 112/82 | Wt 139.6 lb

## 2013-04-26 DIAGNOSIS — Z3400 Encounter for supervision of normal first pregnancy, unspecified trimester: Secondary | ICD-10-CM

## 2013-04-26 MED ORDER — PROMETHAZINE HCL 25 MG PO TABS: 25.0000 mg | ORAL_TABLET | Freq: Four times a day (QID) | ORAL | Status: DC | PRN

## 2013-04-26 NOTE — Progress Notes (Signed)
P-85  Patient is having to go to [place] for her grandmothers funeral and wants to know if she can have something for nausea for the trip.

## 2013-04-26 NOTE — Patient Instructions (Signed)
Second Trimester of Pregnancy The second trimester is from week 13 through week 28, months 4 through 6. The second trimester is often a time when you feel your best. Your body has also adjusted to being pregnant, and you begin to feel better physically. Usually, morning sickness has lessened or quit completely, you may have more energy, and you may have an increase in appetite. The second trimester is also a time when the fetus is growing rapidly. At the end of the sixth month, the fetus is about 9 inches long and weighs about 1 pounds. You will likely begin to feel the baby move (quickening) between 18 and 20 weeks of the pregnancy. BODY CHANGES Your body goes through many changes during pregnancy. The changes vary from woman to woman.   Your weight will continue to increase. You will notice your lower abdomen bulging out.  You may begin to get stretch marks on your hips, abdomen, and breasts.  You may develop headaches that can be relieved by medicines approved by your caregiver.  You may urinate more often because the fetus is pressing on your bladder.  You may develop or continue to have heartburn as a result of your pregnancy.  You may develop constipation because certain hormones are causing the muscles that push waste through your intestines to slow down.  You may develop hemorrhoids or swollen, bulging veins (varicose veins).  You may have back pain because of the weight gain and pregnancy hormones relaxing your joints between the bones in your pelvis and as a result of a shift in weight and the muscles that support your balance.  Your breasts will continue to grow and be tender.  Your gums may bleed and may be sensitive to brushing and flossing.  Dark spots or blotches (chloasma, mask of pregnancy) may develop on your face. This will likely fade after the baby is born.  A dark line from your belly button to the pubic area (linea nigra) may appear. This will likely fade after  the baby is born. WHAT TO EXPECT AT YOUR PRENATAL VISITS During a routine prenatal visit:  You will be weighed to make sure you and the fetus are growing normally.  Your blood pressure will be taken.  Your abdomen will be measured to track your baby's growth.  The fetal heartbeat will be listened to.  Any test results from the previous visit will be discussed. Your caregiver may ask you:  How you are feeling.  If you are feeling the baby move.  If you have had any abnormal symptoms, such as leaking fluid, bleeding, severe headaches, or abdominal cramping.  If you have any questions. Other tests that may be performed during your second trimester include:  Blood tests that check for:  Low iron levels (anemia).  Gestational diabetes (between 24 and 28 weeks).  Rh antibodies.  Urine tests to check for infections, diabetes, or protein in the urine.  An ultrasound to confirm the proper growth and development of the baby.  An amniocentesis to check for possible genetic problems.  Fetal screens for spina bifida and Down syndrome. HOME CARE INSTRUCTIONS   Avoid all smoking, herbs, alcohol, and unprescribed drugs. These chemicals affect the formation and growth of the baby.  Follow your caregiver's instructions regarding medicine use. There are medicines that are either safe or unsafe to take during pregnancy.  Exercise only as directed by your caregiver. Experiencing uterine cramps is a good sign to stop exercising.  Continue to eat regular,   healthy meals.  Wear a good support bra for breast tenderness.  Do not use hot tubs, steam rooms, or saunas.  Wear your seat belt at all times when driving.  Avoid raw meat, uncooked cheese, cat litter boxes, and soil used by cats. These carry germs that can cause birth defects in the baby.  Take your prenatal vitamins.  Try taking a stool softener (if your caregiver approves) if you develop constipation. Eat more high-fiber  foods, such as fresh vegetables or fruit and whole grains. Drink plenty of fluids to keep your urine clear or pale yellow.  Take warm sitz baths to soothe any pain or discomfort caused by hemorrhoids. Use hemorrhoid cream if your caregiver approves.  If you develop varicose veins, wear support hose. Elevate your feet for 15 minutes, 3 4 times a day. Limit salt in your diet.  Avoid heavy lifting, wear low heel shoes, and practice good posture.  Rest with your legs elevated if you have leg cramps or low back pain.  Visit your dentist if you have not gone yet during your pregnancy. Use a soft toothbrush to brush your teeth and be gentle when you floss.  A sexual relationship may be continued unless your caregiver directs you otherwise.  Continue to go to all your prenatal visits as directed by your caregiver. SEEK MEDICAL CARE IF:   You have dizziness.  You have mild pelvic cramps, pelvic pressure, or nagging pain in the abdominal area.  You have persistent nausea, vomiting, or diarrhea.  You have a bad smelling vaginal discharge.  You have pain with urination. SEEK IMMEDIATE MEDICAL CARE IF:   You have a fever.  You are leaking fluid from your vagina.  You have spotting or bleeding from your vagina.  You have severe abdominal cramping or pain.  You have rapid weight gain or loss.  You have shortness of breath with chest pain.  You notice sudden or extreme swelling of your face, hands, ankles, feet, or legs.  You have not felt your baby move in over an hour.  You have severe headaches that do not go away with medicine.  You have vision changes. Document Released: 03/17/2001 Document Revised: 11/23/2012 Document Reviewed: 05/24/2012 [employer] Patient Information 2014 [employer].  Breastfeeding Deciding to breastfeed is one of the best choices you can make for you and your baby. A change in hormones during pregnancy causes your breast tissue to grow and increases the  number and size of your milk ducts. These hormones also allow proteins, sugars, and fats from your blood supply to make breast milk in your milk-producing glands. Hormones prevent breast milk from being released before your baby is born as well as prompt milk flow after birth. Once breastfeeding has begun, thoughts of your baby, as well as his or her sucking or crying, can stimulate the release of milk from your milk-producing glands.  BENEFITS OF BREASTFEEDING For Your Baby  Your first milk (colostrum) helps your baby's digestive system function better.   There are antibodies in your milk that help your baby fight off infections.   Your baby has a lower incidence of asthma, allergies, and sudden infant death syndrome.   The nutrients in breast milk are better for your baby than infant formulas and are designed uniquely for your baby's needs.   Breast milk improves your baby's brain development.   Your baby is less likely to develop other conditions, such as childhood obesity, asthma, or type 2 diabetes mellitus.  For   You   Breastfeeding helps to create a very special bond between you and your baby.   Breastfeeding is convenient. Breast milk is always available at the correct temperature and costs nothing.   Breastfeeding helps to burn calories and helps you lose the weight gained during pregnancy.   Breastfeeding makes your uterus contract to its prepregnancy size faster and slows bleeding (lochia) after you give birth.   Breastfeeding helps to lower your risk of developing type 2 diabetes mellitus, osteoporosis, and breast or ovarian cancer later in life. SIGNS THAT YOUR BABY IS HUNGRY Early Signs of Hunger  Increased alertness or activity.  Stretching.  Movement of the head from side to side.  Movement of the head and opening of the mouth when the corner of the mouth or cheek is stroked (rooting).  Increased sucking sounds, smacking lips, cooing, sighing, or  squeaking.  Hand-to-mouth movements.  Increased sucking of fingers or hands. Late Signs of Hunger  Fussing.  Intermittent crying. Extreme Signs of Hunger Signs of extreme hunger will require calming and consoling before your baby will be able to breastfeed successfully. Do not wait for the following signs of extreme hunger to occur before you initiate breastfeeding:   Restlessness.  A loud, strong cry.   Screaming. BREASTFEEDING BASICS Breastfeeding Initiation  Find a comfortable place to sit or lie down, with your neck and back well supported.  Place a pillow or rolled up blanket under your baby to bring him or her to the level of your breast (if you are seated). Nursing pillows are specially designed to help support your arms and your baby while you breastfeed.  Make sure that your baby's abdomen is facing your abdomen.   Gently massage your breast. With your fingertips, massage from your chest wall toward your nipple in a circular motion. This encourages milk flow. You may need to continue this action during the feeding if your milk flows slowly.  Support your breast with 4 fingers underneath and your thumb above your nipple. Make sure your fingers are well away from your nipple and your baby's mouth.   Stroke your baby's lips gently with your finger or nipple.   When your baby's mouth is open wide enough, quickly bring your baby to your breast, placing your entire nipple and as much of the colored area around your nipple (areola) as possible into your baby's mouth.   More areola should be visible above your baby's upper lip than below the lower lip.   Your baby's tongue should be between his or her lower gum and your breast.   Ensure that your baby's mouth is correctly positioned around your nipple (latched). Your baby's lips should create a seal on your breast and be turned out (everted).  It is common for your baby to suck about 2 3 minutes in order to start the  flow of breast milk. Latching Teaching your baby how to latch on to your breast properly is very important. An improper latch can cause nipple pain and decreased milk supply for you and poor weight gain in your baby. Also, if your baby is not latched onto your nipple properly, he or she may swallow some air during feeding. This can make your baby fussy. Burping your baby when you switch breasts during the feeding can help to get rid of the air. However, teaching your baby to latch on properly is still the best way to prevent fussiness from swallowing air while breastfeeding. Signs that your baby has   successfully latched on to your nipple:    Silent tugging or silent sucking, without causing you pain.   Swallowing heard between every 3 4 sucks.    Muscle movement above and in front of his or her ears while sucking.  Signs that your baby has not successfully latched on to nipple:   Sucking sounds or smacking sounds from your baby while breastfeeding.  Nipple pain. If you think your baby has not latched on correctly, slip your finger into the corner of your baby's mouth to break the suction and place it between your baby's gums. Attempt breastfeeding initiation again. Signs of Successful Breastfeeding Signs from your baby:   A gradual decrease in the number of sucks or complete cessation of sucking.   Falling asleep.   Relaxation of his or her body.   Retention of a small amount of milk in his or her mouth.   Letting go of your breast by himself or herself. Signs from you:  Breasts that have increased in firmness, weight, and size 1 3 hours after feeding.   Breasts that are softer immediately after breastfeeding.  Increased milk volume, as well as a change in milk consistency and color by the 5th day of breastfeeding.   Nipples that are not sore, cracked, or bleeding. Signs That Your Baby is Getting Enough Milk  Wetting at least 3 diapers in a 24-hour period. The urine  should be clear and pale yellow by age 5 days.  At least 3 stools in a 24-hour period by age 5 days. The stool should be soft and yellow.  At least 3 stools in a 24-hour period by age 7 days. The stool should be seedy and yellow.  No loss of weight greater than 10% of birth weight during the first 3 days of age.  Average weight gain of 4 7 ounces (120 210 mL) per week after age 4 days.  Consistent daily weight gain by age 5 days, without weight loss after the age of 2 weeks. After a feeding, your baby may spit up a small amount. This is common. BREASTFEEDING FREQUENCY AND DURATION Frequent feeding will help you make more milk and can prevent sore nipples and breast engorgement. Breastfeed when you feel the need to reduce the fullness of your breasts or when your baby shows signs of hunger. This is called "breastfeeding on demand." Avoid introducing a pacifier to your baby while you are working to establish breastfeeding (the first 4 6 weeks after your baby is born). After this time you may choose to use a pacifier. Research has shown that pacifier use during the first year of a baby's life decreases the risk of sudden infant death syndrome (SIDS). Allow your baby to feed on each breast as long as he or she wants. Breastfeed until your baby is finished feeding. When your baby unlatches or falls asleep while feeding from the first breast, offer the second breast. Because newborns are often sleepy in the first few weeks of life, you may need to awaken your baby to get him or her to feed. Breastfeeding times will vary from baby to baby. However, the following rules can serve as a guide to help you ensure that your baby is properly fed:  Newborns (babies 4 weeks of age or younger) may breastfeed every 1 3 hours.  Newborns should not go longer than 3 hours during the day or 5 hours during the night without breastfeeding.  You should breastfeed your baby a minimum of   8 times in a 24-hour period until  you begin to introduce solid foods to your baby at around 6 months of age. BREAST MILK PUMPING Pumping and storing breast milk allows you to ensure that your baby is exclusively fed your breast milk, even at times when you are unable to breastfeed. This is especially important if you are going back to work while you are still breastfeeding or when you are not able to be present during feedings. Your lactation consultant can give you guidelines on how long it is safe to store breast milk.  A breast pump is a machine that allows you to pump milk from your breast into a sterile bottle. The pumped breast milk can then be stored in a refrigerator or freezer. Some breast pumps are operated by hand, while others use electricity. Ask your lactation consultant which type will work best for you. Breast pumps can be purchased, but some hospitals and breastfeeding support groups lease breast pumps on a monthly basis. A lactation consultant can teach you how to hand express breast milk, if you prefer not to use a pump.  CARING FOR YOUR BREASTS WHILE YOU BREASTFEED Nipples can become dry, cracked, and sore while breastfeeding. The following recommendations can help keep your breasts moisturized and healthy:  Avoid using soap on your nipples.   Wear a supportive bra. Although not required, special nursing bras and tank tops are designed to allow access to your breasts for breastfeeding without taking off your entire bra or top. Avoid wearing underwire style bras or extremely tight bras.  Air dry your nipples for 3 4minutes after each feeding.   Use only cotton bra pads to absorb leaked breast milk. Leaking of breast milk between feedings is normal.   Use lanolin on your nipples after breastfeeding. Lanolin helps to maintain your skin's normal moisture barrier. If you use pure lanolin you do not need to wash it off before feeding your baby again. Pure lanolin is not toxic to your baby. You may also hand express a  few drops of breast milk and gently massage that milk into your nipples and allow the milk to air dry. In the first few weeks after giving birth, some women experience extremely full breasts (engorgement). Engorgement can make your breasts feel heavy, warm, and tender to the touch. Engorgement peaks within 3 5 days after you give birth. The following recommendations can help ease engorgement:  Completely empty your breasts while breastfeeding or pumping. You may want to start by applying warm, moist heat (in the shower or with warm water-soaked hand towels) just before feeding or pumping. This increases circulation and helps the milk flow. If your baby does not completely empty your breasts while breastfeeding, pump any extra milk after he or she is finished.  Wear a snug bra (nursing or regular) or tank top for 1 2 days to signal your body to slightly decrease milk production.  Apply ice packs to your breasts, unless this is too uncomfortable for you.  Make sure that your baby is latched on and positioned properly while breastfeeding. If engorgement persists after 48 hours of following these recommendations, contact your health care provider or a lactation consultant. OVERALL HEALTH CARE RECOMMENDATIONS WHILE BREASTFEEDING  Eat healthy foods. Alternate between meals and snacks, eating 3 of each per day. Because what you eat affects your breast milk, some of the foods may make your baby more irritable than usual. Avoid eating these foods if you are sure that they are   negatively affecting your baby.  Drink milk, fruit juice, and water to satisfy your thirst (about 10 glasses a day).   Rest often, relax, and continue to take your prenatal vitamins to prevent fatigue, stress, and anemia.  Continue breast self-awareness checks.  Avoid chewing and smoking tobacco.  Avoid alcohol and drug use. Some medicines that may be harmful to your baby can pass through breast milk. It is important to ask your  health care provider before taking any medicine, including all over-the-counter and prescription medicine as well as vitamin and herbal supplements. It is possible to become pregnant while breastfeeding. If birth control is desired, ask your health care provider about options that will be safe for your baby. SEEK MEDICAL CARE IF:   You feel like you want to stop breastfeeding or have become frustrated with breastfeeding.  You have painful breasts or nipples.  Your nipples are cracked or bleeding.  Your breasts are red, tender, or warm.  You have a swollen area on either breast.  You have a fever or chills.  You have nausea or vomiting.  You have drainage other than breast milk from your nipples.  Your breasts do not become full before feedings by the 5th day after you give birth.  You feel sad and depressed.  Your baby is too sleepy to eat well.  Your baby is having trouble sleeping.   Your baby is wetting less than 3 diapers in a 24-hour period.  Your baby has less than 3 stools in a 24-hour period.  Your baby's skin or the white part of his or her eyes becomes yellow.   Your baby is not gaining weight by 5 days of age. SEEK IMMEDIATE MEDICAL CARE IF:   Your baby is overly tired (lethargic) and does not want to wake up and feed.  Your baby develops an unexplained fever. Document Released: 03/23/2005 Document Revised: 11/23/2012 Document Reviewed: 09/14/2012 [employer] Patient Information 2014 [employer].  

## 2013-04-26 NOTE — Progress Notes (Signed)
Discussed traveling--risks of clots Phenergan for nausea for the trip Declines flu shot.

## 2013-04-27 ENCOUNTER — Encounter: Payer: Self-pay | Admitting: Family Medicine

## 2013-04-27 DIAGNOSIS — O9989 Other specified diseases and conditions complicating pregnancy, childbirth and the puerperium: Secondary | ICD-10-CM

## 2013-04-27 DIAGNOSIS — Z2839 Other underimmunization status: Secondary | ICD-10-CM | POA: Insufficient documentation

## 2013-04-27 DIAGNOSIS — O09899 Supervision of other high risk pregnancies, unspecified trimester: Secondary | ICD-10-CM | POA: Insufficient documentation

## 2013-04-27 DIAGNOSIS — Z283 Underimmunization status: Secondary | ICD-10-CM | POA: Insufficient documentation

## 2013-04-27 LAB — OBSTETRIC PANEL
Antibody Screen: NEGATIVE
Basophils Absolute: 0 10*3/uL (ref 0.0–0.1)
Basophils Relative: 0 % (ref 0–1)
Eosinophils Absolute: 0.3 10*3/uL (ref 0.0–0.7)
Eosinophils Relative: 2 % (ref 0–5)
HCT: 35.7 % — ABNORMAL LOW (ref 36.0–46.0)
HEMOGLOBIN: 12 g/dL (ref 12.0–15.0)
HEP B S AG: NEGATIVE
LYMPHS ABS: 1.4 10*3/uL (ref 0.7–4.0)
LYMPHS PCT: 11 % — AB (ref 12–46)
MCH: 30.4 pg (ref 26.0–34.0)
MCHC: 33.6 g/dL (ref 30.0–36.0)
MCV: 90.4 fL (ref 78.0–100.0)
MONOS PCT: 6 % (ref 3–12)
Monocytes Absolute: 0.8 10*3/uL (ref 0.1–1.0)
NEUTROS ABS: 10.6 10*3/uL — AB (ref 1.7–7.7)
NEUTROS PCT: 81 % — AB (ref 43–77)
PLATELETS: 336 10*3/uL (ref 150–400)
RBC: 3.95 MIL/uL (ref 3.87–5.11)
RDW: 13.8 % (ref 11.5–15.5)
RH TYPE: POSITIVE
Rubella: 0.34 Index (ref <0.90)
WBC: 13.1 10*3/uL — AB (ref 4.0–10.5)

## 2013-04-27 LAB — CULTURE, OB URINE
Colony Count: NO GROWTH
Organism ID, Bacteria: NO GROWTH

## 2013-04-27 LAB — HIV ANTIBODY (ROUTINE TESTING W REFLEX): HIV: NONREACTIVE

## 2013-05-24 ENCOUNTER — Encounter: Payer: Medicaid Other | Admitting: Obstetrics & Gynecology

## 2013-05-30 ENCOUNTER — Encounter: Payer: Medicaid Other | Admitting: Obstetrics & Gynecology

## 2013-06-02 ENCOUNTER — Ambulatory Visit (INDEPENDENT_AMBULATORY_CARE_PROVIDER_SITE_OTHER): Payer: Medicaid Other | Admitting: Nurse Practitioner

## 2013-06-02 VITALS — BP 109/80 | Wt 145.1 lb

## 2013-06-02 DIAGNOSIS — Z3400 Encounter for supervision of normal first pregnancy, unspecified trimester: Secondary | ICD-10-CM

## 2013-06-02 DIAGNOSIS — Z23 Encounter for immunization: Secondary | ICD-10-CM

## 2013-06-02 DIAGNOSIS — Z3403 Encounter for supervision of normal first pregnancy, third trimester: Secondary | ICD-10-CM

## 2013-06-02 LAB — CBC
HCT: 34.3 % — ABNORMAL LOW (ref 36.0–46.0)
HEMOGLOBIN: 11.9 g/dL — AB (ref 12.0–15.0)
MCH: 29.7 pg (ref 26.0–34.0)
MCHC: 34.7 g/dL (ref 30.0–36.0)
MCV: 85.5 fL (ref 78.0–100.0)
PLATELETS: 330 10*3/uL (ref 150–400)
RBC: 4.01 MIL/uL (ref 3.87–5.11)
RDW: 13.2 % (ref 11.5–15.5)
WBC: 14 10*3/uL — AB (ref 4.0–10.5)

## 2013-06-02 NOTE — Progress Notes (Signed)
Doing well. Reviewed preterm labor and when to go to hospital. Advised to watch weight gain. One hour Glucola today

## 2013-06-02 NOTE — Progress Notes (Signed)
P-100.  Doing 1hr Gtt today.

## 2013-06-03 LAB — RPR

## 2013-06-03 LAB — GLUCOSE TOLERANCE, 1 HOUR (50G) W/O FASTING: Glucose, 1 Hour GTT: 95 mg/dL (ref 70–140)

## 2013-06-03 LAB — HIV ANTIBODY (ROUTINE TESTING W REFLEX): HIV: NONREACTIVE

## 2013-06-07 ENCOUNTER — Telehealth: Payer: Self-pay | Admitting: *Deleted

## 2013-06-07 NOTE — Telephone Encounter (Signed)
Patient is calling for her glucose test results.  Results reviewed with patient and they are within normal limits.

## 2013-06-22 ENCOUNTER — Encounter: Payer: Self-pay | Admitting: Obstetrics & Gynecology

## 2013-06-22 ENCOUNTER — Ambulatory Visit (INDEPENDENT_AMBULATORY_CARE_PROVIDER_SITE_OTHER): Payer: Medicaid Other | Admitting: Obstetrics & Gynecology

## 2013-06-22 VITALS — BP 116/87 | Wt 147.2 lb

## 2013-06-22 DIAGNOSIS — Z3400 Encounter for supervision of normal first pregnancy, unspecified trimester: Secondary | ICD-10-CM

## 2013-06-22 NOTE — Progress Notes (Signed)
P=111 

## 2013-06-22 NOTE — Progress Notes (Signed)
Routine visit. Good FM. No problems.  

## 2013-07-06 ENCOUNTER — Ambulatory Visit (INDEPENDENT_AMBULATORY_CARE_PROVIDER_SITE_OTHER): Payer: Medicaid Other | Admitting: Obstetrics & Gynecology

## 2013-07-06 ENCOUNTER — Encounter: Payer: Self-pay | Admitting: Obstetrics & Gynecology

## 2013-07-06 VITALS — BP 120/79 | Wt 151.0 lb

## 2013-07-06 DIAGNOSIS — Z3400 Encounter for supervision of normal first pregnancy, unspecified trimester: Secondary | ICD-10-CM

## 2013-07-06 NOTE — Progress Notes (Signed)
Routine visit. Good FM. No problems.  

## 2013-07-06 NOTE — Progress Notes (Signed)
P-96 

## 2013-07-19 ENCOUNTER — Ambulatory Visit (INDEPENDENT_AMBULATORY_CARE_PROVIDER_SITE_OTHER): Payer: Medicaid Other | Admitting: Obstetrics & Gynecology

## 2013-07-19 VITALS — BP 114/82 | Wt 150.2 lb

## 2013-07-19 DIAGNOSIS — Z3400 Encounter for supervision of normal first pregnancy, unspecified trimester: Secondary | ICD-10-CM

## 2013-07-19 NOTE — Patient Instructions (Signed)
Return to clinic for any obstetric concerns or go to MAU for evaluation  

## 2013-07-19 NOTE — Progress Notes (Signed)
No other complaints or concerns.  Fetal movement and labor precautions reviewed.  Pelvic cultures next visit. 

## 2013-07-19 NOTE — Progress Notes (Signed)
P = 87 

## 2013-07-23 ENCOUNTER — Inpatient Hospital Stay (HOSPITAL_COMMUNITY)
Admission: AD | Admit: 2013-07-23 | Discharge: 2013-07-23 | Disposition: A | Discharge status: Home / Self Care | Payer: Medicaid Other | Source: Ambulatory Visit | Attending: Obstetrics & Gynecology | Admitting: Obstetrics & Gynecology

## 2013-07-23 ENCOUNTER — Encounter (HOSPITAL_COMMUNITY): Payer: Self-pay | Admitting: *Deleted

## 2013-07-23 DIAGNOSIS — O09899 Supervision of other high risk pregnancies, unspecified trimester: Secondary | ICD-10-CM

## 2013-07-23 DIAGNOSIS — Z2839 Other underimmunization status: Secondary | ICD-10-CM

## 2013-07-23 DIAGNOSIS — O99891 Other specified diseases and conditions complicating pregnancy: Secondary | ICD-10-CM | POA: Insufficient documentation

## 2013-07-23 DIAGNOSIS — O479 False labor, unspecified: Secondary | ICD-10-CM

## 2013-07-23 DIAGNOSIS — Z283 Underimmunization status: Secondary | ICD-10-CM

## 2013-07-23 DIAGNOSIS — O9989 Other specified diseases and conditions complicating pregnancy, childbirth and the puerperium: Secondary | ICD-10-CM

## 2013-07-23 DIAGNOSIS — Z3400 Encounter for supervision of normal first pregnancy, unspecified trimester: Secondary | ICD-10-CM

## 2013-07-23 DIAGNOSIS — R109 Unspecified abdominal pain: Secondary | ICD-10-CM | POA: Insufficient documentation

## 2013-07-23 LAB — URINALYSIS, ROUTINE W REFLEX MICROSCOPIC
Bilirubin Urine: NEGATIVE
Glucose, UA: NEGATIVE mg/dL
Hgb urine dipstick: NEGATIVE
Ketones, ur: NEGATIVE mg/dL
Leukocytes, UA: NEGATIVE
Nitrite: NEGATIVE
PROTEIN: NEGATIVE mg/dL
UROBILINOGEN UA: 0.2 mg/dL (ref 0.0–1.0)
pH: 6 (ref 5.0–8.0)

## 2013-07-23 LAB — WET PREP, GENITAL
Clue Cells Wet Prep HPF POC: NONE SEEN
TRICH WET PREP: NONE SEEN
Yeast Wet Prep HPF POC: NONE SEEN

## 2013-07-23 NOTE — MAU Provider Note (Signed)
None     Chief Complaint:  Abdominal Cramping   Emily Mack is  23 y.o. G1P0 at 6467w6d presents complaining of Abdominal Cramping Pt complains of lower abodminal cramping that started Friday. It occurs any time she is walking and improves with laying down. 6-7/10 occuring 20 mins apart. Normal fetal movement, no lof, no vb.   Obstetrical/Gynecological History: OB History   Grav Para Term Preterm Abortions TAB SAB Ect Mult Living   1         0     Past Medical History: Past Medical History  Diagnosis Date  . Medical history non-contributory     Past Surgical History: Past Surgical History  Procedure Laterality Date  . Tympanoplasty      age 89    Family History: Family History  Problem Relation Age of Onset  . Diabetes Mother     Social History: History  Substance Use Topics  . Smoking status: Never Smoker   . Smokeless tobacco: Never Used  . Alcohol Use: No    Allergies: No Known Allergies  Meds:  Prescriptions prior to admission  Medication Sig Dispense Refill  . calcium carbonate (TUMS - DOSED IN MG ELEMENTAL CALCIUM) 500 MG chewable tablet Chew 1 tablet by mouth daily as needed for indigestion or heartburn.      . Prenatal Vit-Fe Fumarate-FA (PRENATAL MULTIVITAMIN) TABS tablet Take 1 tablet by mouth daily at 12 noon.        Review of Systems -   Review of Systems  No f/s, no n/v, no d/c, no discharge, no headache today, no vision changes, no itching. No other complaints   Physical Exam  Blood pressure 124/84, pulse 110, temperature 98.5 F (36.9 C), temperature source Oral, resp. rate 18, height 4\' 11"  (1.499 m), weight 69.763 kg (153 lb 12.8 oz), last menstrual period 11/14/2012. GENERAL: Well-developed, well-nourished female in no acute distress.  LUNGS: Clear to auscultation bilaterally.  HEART: Regular rate and rhythm. ABDOMEN: Soft, nontender, nondistended, gravid.  EXTREMITIES: Nontender, no edema, 2+ distal pulses. DTR's 2+ on left 3+ on  right  Dilation: Closed Effacement (%): Thick Cervical Position: Middle Station: -3 Exam by:: Dr. Ike Benedom  FHT:  Baseline rate 140s bpm   Variability moderate  Accelerations 15x15 Decelerations none Contractions: quiet   Labs: Results for orders placed during the hospital encounter of 07/23/13 (from the past 24 hour(s))  URINALYSIS, ROUTINE W REFLEX MICROSCOPIC   Collection Time    07/23/13  7:10 PM      Result Value Ref Range   Color, Urine YELLOW  YELLOW   APPearance CLEAR  CLEAR   Specific Gravity, Urine <1.005 (*) 1.005 - 1.030   pH 6.0  5.0 - 8.0   Glucose, UA NEGATIVE  NEGATIVE mg/dL   Hgb urine dipstick NEGATIVE  NEGATIVE   Bilirubin Urine NEGATIVE  NEGATIVE   Ketones, ur NEGATIVE  NEGATIVE mg/dL   Protein, ur NEGATIVE  NEGATIVE mg/dL   Urobilinogen, UA 0.2  0.0 - 1.0 mg/dL   Nitrite NEGATIVE  NEGATIVE   Leukocytes, UA NEGATIVE  NEGATIVE   Imaging Studies:  No results found.  Assessment: Emily Mack is  23 y.o. G1P0 at 3767w6d presents with abdominal cramping with walking.  Plan: -likely braton-hicks contractions - reassurance given  - cervix closed thick and high - [facility] - labor precautions discussed.    Cat I tracing    Minta BalsamMichael R Odom 4/19/20157:59 PM  Pt discharged for Dr. Ike Benedom.  No change to plan as above.   Vale HavenKeli L Rupal Childress, MD

## 2013-07-23 NOTE — MAU Note (Signed)
Pt reports lower abdominal pain and lower back pain since Friday.

## 2013-07-23 NOTE — Discharge Instructions (Signed)

## 2013-07-26 LAB — CULTURE, BETA STREP (GROUP B ONLY)

## 2013-07-26 LAB — OB RESULTS CONSOLE GBS: GBS: NEGATIVE

## 2013-07-26 NOTE — MAU Provider Note (Signed)
Attestation of Attending Supervision of Advanced Practitioner (CNM/NP): Evaluation and management procedures were performed by the Advanced Practitioner under my supervision and collaboration.  I have reviewed the Advanced Practitioner's note and chart, and I agree with the management and plan.  Karstyn Birkey Harraway-Smith 1:57 PM

## 2013-07-27 ENCOUNTER — Encounter: Payer: Self-pay | Admitting: Obstetrics & Gynecology

## 2013-07-27 ENCOUNTER — Ambulatory Visit (INDEPENDENT_AMBULATORY_CARE_PROVIDER_SITE_OTHER): Payer: Medicaid Other | Admitting: Obstetrics & Gynecology

## 2013-07-27 VITALS — BP 121/79 | HR 79 | Wt 151.0 lb

## 2013-07-27 DIAGNOSIS — Z3400 Encounter for supervision of normal first pregnancy, unspecified trimester: Secondary | ICD-10-CM

## 2013-07-27 DIAGNOSIS — Z3493 Encounter for supervision of normal pregnancy, unspecified, third trimester: Secondary | ICD-10-CM

## 2013-07-27 LAB — OB RESULTS CONSOLE GC/CHLAMYDIA
Chlamydia: NEGATIVE
GC PROBE AMP, GENITAL: NEGATIVE

## 2013-07-27 NOTE — Progress Notes (Signed)
Routine visit. She was seen in the MAU for suspected food poisining. She is ok and they did GBS then. I will get GC/CT testing today. Good FM. No problems. Labor precautions reviewed.

## 2013-07-28 LAB — GC/CHLAMYDIA PROBE AMP, URINE
CHLAMYDIA, SWAB/URINE, PCR: NEGATIVE
GC Probe Amp, Urine: NEGATIVE

## 2013-08-02 ENCOUNTER — Encounter: Payer: Self-pay | Admitting: Family Medicine

## 2013-08-02 ENCOUNTER — Ambulatory Visit (INDEPENDENT_AMBULATORY_CARE_PROVIDER_SITE_OTHER): Payer: Medicaid Other | Admitting: Family Medicine

## 2013-08-02 VITALS — BP 129/88 | HR 91 | Wt 153.0 lb

## 2013-08-02 DIAGNOSIS — Z3400 Encounter for supervision of normal first pregnancy, unspecified trimester: Secondary | ICD-10-CM

## 2013-08-02 NOTE — Progress Notes (Signed)
Doing well. Labor precautions given GBS negative.

## 2013-08-02 NOTE — Patient Instructions (Addendum)
Breastfeeding Deciding to breastfeed is one of the best choices you can make for you and your baby. A change in hormones during pregnancy causes your breast tissue to grow and increases the number and size of your milk ducts. These hormones also allow proteins, sugars, and fats from your blood supply to make breast milk in your milk-producing glands. Hormones prevent breast milk from being released before your baby is born as well as prompt milk flow after birth. Once breastfeeding has begun, thoughts of your baby, as well as his or her sucking or crying, can stimulate the release of milk from your milk-producing glands.  BENEFITS OF BREASTFEEDING For Your Baby  Your first milk (colostrum) helps your baby's digestive system function better.   There are antibodies in your milk that help your baby fight off infections.   Your baby has a lower incidence of asthma, allergies, and sudden infant death syndrome.   The nutrients in breast milk are better for your baby than infant formulas and are designed uniquely for your baby's needs.   Breast milk improves your baby's brain development.   Your baby is less likely to develop other conditions, such as childhood obesity, asthma, or type 2 diabetes mellitus.  For You   Breastfeeding helps to create a very special bond between you and your baby.   Breastfeeding is convenient. Breast milk is always available at the correct temperature and costs nothing.   Breastfeeding helps to burn calories and helps you lose the weight gained during pregnancy.   Breastfeeding makes your uterus contract to its prepregnancy size faster and slows bleeding (lochia) after you give birth.   Breastfeeding helps to lower your risk of developing type 2 diabetes mellitus, osteoporosis, and breast or ovarian cancer later in life. SIGNS THAT YOUR BABY IS HUNGRY Early Signs of Hunger  Increased alertness or activity.  Stretching.  Movement of the head from  side to side.  Movement of the head and opening of the mouth when the corner of the mouth or cheek is stroked (rooting).  Increased sucking sounds, smacking lips, cooing, sighing, or squeaking.  Hand-to-mouth movements.  Increased sucking of fingers or hands. Late Signs of Hunger  Fussing.  Intermittent crying. Extreme Signs of Hunger Signs of extreme hunger will require calming and consoling before your baby will be able to breastfeed successfully. Do not wait for the following signs of extreme hunger to occur before you initiate breastfeeding:   Restlessness.  A loud, strong cry.   Screaming. BREASTFEEDING BASICS Breastfeeding Initiation  Find a comfortable place to sit or lie down, with your neck and back well supported.  Place a pillow or rolled up blanket under your baby to bring him or her to the level of your breast (if you are seated). Nursing pillows are specially designed to help support your arms and your baby while you breastfeed.  Make sure that your baby's abdomen is facing your abdomen.   Gently massage your breast. With your fingertips, massage from your chest wall toward your nipple in a circular motion. This encourages milk flow. You may need to continue this action during the feeding if your milk flows slowly.  Support your breast with 4 fingers underneath and your thumb above your nipple. Make sure your fingers are well away from your nipple and your baby's mouth.   Stroke your baby's lips gently with your finger or nipple.   When your baby's mouth is open wide enough, quickly bring your baby to your   breast, placing your entire nipple and as much of the colored area around your nipple (areola) as possible into your baby's mouth.   More areola should be visible above your baby's upper lip than below the lower lip.   Your baby's tongue should be between his or her lower gum and your breast.   Ensure that your baby's mouth is correctly positioned  around your nipple (latched). Your baby's lips should create a seal on your breast and be turned out (everted).  It is common for your baby to suck about 2 3 minutes in order to start the flow of breast milk. Latching Teaching your baby how to latch on to your breast properly is very important. An improper latch can cause nipple pain and decreased milk supply for you and poor weight gain in your baby. Also, if your baby is not latched onto your nipple properly, he or she may swallow some air during feeding. This can make your baby fussy. Burping your baby when you switch breasts during the feeding can help to get rid of the air. However, teaching your baby to latch on properly is still the best way to prevent fussiness from swallowing air while breastfeeding. Signs that your baby has successfully latched on to your nipple:    Silent tugging or silent sucking, without causing you pain.   Swallowing heard between every 3 4 sucks.    Muscle movement above and in front of his or her ears while sucking.  Signs that your baby has not successfully latched on to nipple:   Sucking sounds or smacking sounds from your baby while breastfeeding.  Nipple pain. If you think your baby has not latched on correctly, slip your finger into the corner of your baby's mouth to break the suction and place it between your baby's gums. Attempt breastfeeding initiation again. Signs of Successful Breastfeeding Signs from your baby:   A gradual decrease in the number of sucks or complete cessation of sucking.   Falling asleep.   Relaxation of his or her body.   Retention of a small amount of milk in his or her mouth.   Letting go of your breast by himself or herself. Signs from you:  Breasts that have increased in firmness, weight, and size 1 3 hours after feeding.   Breasts that are softer immediately after breastfeeding.  Increased milk volume, as well as a change in milk consistency and color by  the 5th day of breastfeeding.   Nipples that are not sore, cracked, or bleeding. Signs That Your Baby is Getting Enough Milk  Wetting at least 3 diapers in a 24-hour period. The urine should be clear and pale yellow by age 5 days.  At least 3 stools in a 24-hour period by age 5 days. The stool should be soft and yellow.  At least 3 stools in a 24-hour period by age 7 days. The stool should be seedy and yellow.  No loss of weight greater than 10% of birth weight during the first 3 days of age.  Average weight gain of 4 7 ounces (120 210 mL) per week after age 4 days.  Consistent daily weight gain by age 5 days, without weight loss after the age of 2 weeks. After a feeding, your baby may spit up a small amount. This is common. BREASTFEEDING FREQUENCY AND DURATION Frequent feeding will help you make more milk and can prevent sore nipples and breast engorgement. Breastfeed when you feel the need to reduce   the fullness of your breasts or when your baby shows signs of hunger. This is called "breastfeeding on demand." Avoid introducing a pacifier to your baby while you are working to establish breastfeeding (the first 4 6 weeks after your baby is born). After this time you may choose to use a pacifier. Research has shown that pacifier use during the first year of a baby's life decreases the risk of sudden infant death syndrome (SIDS). Allow your baby to feed on each breast as long as he or she wants. Breastfeed until your baby is finished feeding. When your baby unlatches or falls asleep while feeding from the first breast, offer the second breast. Because newborns are often sleepy in the first few weeks of life, you may need to awaken your baby to get him or her to feed. Breastfeeding times will vary from baby to baby. However, the following rules can serve as a guide to help you ensure that your baby is properly fed:  Newborns (babies 4 weeks of age or younger) may breastfeed every 1 3  hours.  Newborns should not go longer than 3 hours during the day or 5 hours during the night without breastfeeding.  You should breastfeed your baby a minimum of 8 times in a 24-hour period until you begin to introduce solid foods to your baby at around 6 months of age. BREAST MILK PUMPING Pumping and storing breast milk allows you to ensure that your baby is exclusively fed your breast milk, even at times when you are unable to breastfeed. This is especially important if you are going back to work while you are still breastfeeding or when you are not able to be present during feedings. Your lactation consultant can give you guidelines on how long it is safe to store breast milk.  A breast pump is a machine that allows you to pump milk from your breast into a sterile bottle. The pumped breast milk can then be stored in a refrigerator or freezer. Some breast pumps are operated by hand, while others use electricity. Ask your lactation consultant which type will work best for you. Breast pumps can be purchased, but some hospitals and breastfeeding support groups lease breast pumps on a monthly basis. A lactation consultant can teach you how to hand express breast milk, if you prefer not to use a pump.  CARING FOR YOUR BREASTS WHILE YOU BREASTFEED Nipples can become dry, cracked, and sore while breastfeeding. The following recommendations can help keep your breasts moisturized and healthy:  Avoid using soap on your nipples.   Wear a supportive bra. Although not required, special nursing bras and tank tops are designed to allow access to your breasts for breastfeeding without taking off your entire bra or top. Avoid wearing underwire style bras or extremely tight bras.  Air dry your nipples for 3 4minutes after each feeding.   Use only cotton bra pads to absorb leaked breast milk. Leaking of breast milk between feedings is normal.   Use lanolin on your nipples after breastfeeding. Lanolin helps to  maintain your skin's normal moisture barrier. If you use pure lanolin you do not need to wash it off before feeding your baby again. Pure lanolin is not toxic to your baby. You may also hand express a few drops of breast milk and gently massage that milk into your nipples and allow the milk to air dry. In the first few weeks after giving birth, some women experience extremely full breasts (engorgement). Engorgement can make   your breasts feel heavy, warm, and tender to the touch. Engorgement peaks within 3 5 days after you give birth. The following recommendations can help ease engorgement:  Completely empty your breasts while breastfeeding or pumping. You may want to start by applying warm, moist heat (in the shower or with warm water-soaked hand towels) just before feeding or pumping. This increases circulation and helps the milk flow. If your baby does not completely empty your breasts while breastfeeding, pump any extra milk after he or she is finished.  Wear a snug bra (nursing or regular) or tank top for 1 2 days to signal your body to slightly decrease milk production.  Apply ice packs to your breasts, unless this is too uncomfortable for you.  Make sure that your baby is latched on and positioned properly while breastfeeding. If engorgement persists after 48 hours of following these recommendations, contact your health care provider or a lactation consultant. OVERALL HEALTH CARE RECOMMENDATIONS WHILE BREASTFEEDING  Eat healthy foods. Alternate between meals and snacks, eating 3 of each per day. Because what you eat affects your breast milk, some of the foods may make your baby more irritable than usual. Avoid eating these foods if you are sure that they are negatively affecting your baby.  Drink milk, fruit juice, and water to satisfy your thirst (about 10 glasses a day).   Rest often, relax, and continue to take your prenatal vitamins to prevent fatigue, stress, and anemia.  Continue  breast self-awareness checks.  Avoid chewing and smoking tobacco.  Avoid alcohol and drug use. Some medicines that may be harmful to your baby can pass through breast milk. It is important to ask your health care provider before taking any medicine, including all over-the-counter and prescription medicine as well as vitamin and herbal supplements. It is possible to become pregnant while breastfeeding. If birth control is desired, ask your health care provider about options that will be safe for your baby. SEEK MEDICAL CARE IF:   You feel like you want to stop breastfeeding or have become frustrated with breastfeeding.  You have painful breasts or nipples.  Your nipples are cracked or bleeding.  Your breasts are red, tender, or warm.  You have a swollen area on either breast.  You have a fever or chills.  You have nausea or vomiting.  You have drainage other than breast milk from your nipples.  Your breasts do not become full before feedings by the 5th day after you give birth.  You feel sad and depressed.  Your baby is too sleepy to eat well.  Your baby is having trouble sleeping.   Your baby is wetting less than 3 diapers in a 24-hour period.  Your baby has less than 3 stools in a 24-hour period.  Your baby's skin or the white part of his or her eyes becomes yellow.   Your baby is not gaining weight by 5 days of age. SEEK IMMEDIATE MEDICAL CARE IF:   Your baby is overly tired (lethargic) and does not want to wake up and feed.  Your baby develops an unexplained fever. Document Released: 03/23/2005 Document Revised: 11/23/2012 Document Reviewed: 09/14/2012 [employer] Patient Information 2014 [employer]. Vaginal Delivery During delivery, your health care provider will help you give birth to your baby. During a vaginal delivery, you will work to push the baby out of your vagina. However, before you can push your baby out, a few things need to happen. The opening of  your uterus (cervix) has   to soften, thin out, and open up (dilate) all the way to 10 cm. Also, your baby has to move down from the uterus into your vagina.  SIGNS OF LABOR  Your health care provider will first need to make sure you are in labor. Signs of labor include:   Passing what is called the mucous plug before labor begins. This is a small amount of blood-stained mucus.   Having regular, painful uterine contractions.   The time between contractions gets shorter.   The discomfort and pain gradually get more intense.  Contraction pains get worse when walking and do not go away when resting.   Your cervix becomes thinner (effacement) and dilates. BEFORE THE DELIVERY Once you are in labor and admitted into the hospital or care center, your health care provider may do the following:   Perform a complete physical exam.  Review any complications related to pregnancy or labor.  Check your blood pressure, pulse, temperature, and heart rate (vital signs).   Determine if, and when, the rupture of amniotic membranes occurred.  Do a vaginal exam (using a sterile glove and lubricant) to determine:   The position (presentation) of the baby. Is the baby's head presenting first (vertex) in the birth canal (vagina), or are the feet or buttocks first (breech)?   The level (station) of the baby's head within the birth canal.   The effacement and dilatation of the cervix.   An electronic fetal monitor is usually placed on your abdomen when you first arrive. This is used to monitor your contractions and the baby's heart rate.  When the monitor is on your abdomen (external fetal monitor), it can only pick up the frequency and length of your contractions. It cannot tell the strength of your contractions.  If it becomes necessary for your health care provider to know exactly how strong your contractions are or to see exactly what the baby's heart rate is doing, an internal monitor may be  inserted into your vagina and uterus. Your health care provider will discuss the benefits and risks of using an internal monitor and obtain your permission before inserting the device.  Continuous fetal monitoring may be needed if you have an epidural, are receiving certain medicines (such as oxytocin), or have pregnancy or labor complications.  An IV access tube may be placed into a vein in your arm to deliver fluids and medicines if necessary. THREE STAGES OF LABOR AND DELIVERY Normal labor and delivery is divided into three stages. First Stage This stage starts when you begin to contract regularly and your cervix begins to efface and dilate. It ends when your cervix is completely open (fully dilated). The first stage is the longest stage of labor and can last from 3 hours to 15 hours.  Several methods are available to help with labor pain. You and your health care provider will decide which option is best for you. Options include:   Opioid medicines. These are strong pain medicines that you can get through your IV tube or as a shot into your muscle. These medicines lessen pain but do not make it go away completely.  Epidural. A medicine is given through a thin tube that is inserted in your back. The medicine numbs the lower part of your body and prevents any pain in that area.  Paracervical pain medicine. This is an injection of an anesthetic on each side of your cervix.   You may request natural childbirth, which does not involve the use   of pain medicines or an epidural during labor and delivery. Instead, you will use other things, such as breathing exercises, to help cope with the pain. Second Stage The second stage of labor begins when your cervix is fully dilated at 10 cm. It continues until you push your baby down through the birth canal and the baby is born. This stage can take only minutes or several hours.  The location of your baby's head as it moves through the birth canal is  reported as a number called a station. If the baby's head has not started its descent, the station is described as being at minus 3 ( 3). When your baby's head is at the zero station, it is at the middle of the birth canal and is engaged in the pelvis. The station of your baby helps indicate the progress of the second stage of labor.  When your baby is born, your health care provider may hold the baby with his or her head lowered to prevent amniotic fluid, mucus, and blood from getting into the baby's lungs. The baby's mouth and nose may be suctioned with a small bulb syringe to remove any additional fluid.  Your health care provider may then place the baby on your stomach. It is important to keep the baby from getting cold. To do this, the health care provider will dry the baby off, place the baby directly on your skin (with no blankets between you and the baby), and cover the baby with warm, dry blankets.   The umbilical cord is cut. Third Stage During the third stage of labor, your health care provider will deliver the placenta (afterbirth) and make sure your bleeding is under control. The delivery of the placenta usually takes about 5 minutes but can take up to 30 minutes. After the placenta is delivered, a medicine may be given either by IV or injection to help contract the uterus and control bleeding. If you are planning to breastfeed, you can try to do so now. After you deliver the placenta, your uterus should contract and get very firm. If your uterus does not remain firm, your health care provider will massage it. This is important because the contraction of the uterus helps cut off bleeding at the site where the placenta was attached to your uterus. If your uterus does not contract properly and stay firm, you may continue to bleed heavily. If there is a lot of bleeding, medicines may be given to contract the uterus and stop the bleeding.  Document Released: 12/31/2007 Document Revised:  11/23/2012 Document Reviewed: 09/11/2012 [employer] Patient Information 2014 [employer].  

## 2013-08-09 ENCOUNTER — Ambulatory Visit (INDEPENDENT_AMBULATORY_CARE_PROVIDER_SITE_OTHER): Payer: Medicaid Other | Admitting: Obstetrics & Gynecology

## 2013-08-09 ENCOUNTER — Encounter: Payer: Self-pay | Admitting: Obstetrics & Gynecology

## 2013-08-09 VITALS — BP 116/83 | HR 93 | Wt 154.8 lb

## 2013-08-09 DIAGNOSIS — Z3400 Encounter for supervision of normal first pregnancy, unspecified trimester: Secondary | ICD-10-CM

## 2013-08-09 NOTE — Patient Instructions (Signed)
Return to clinic for any obstetric concerns or go to MAU for evaluation  

## 2013-08-09 NOTE — Progress Notes (Signed)
Unchanged cervical exam.  No other complaints or concerns.  Fetal movement and labor precautions reviewed.

## 2013-08-15 ENCOUNTER — Ambulatory Visit (INDEPENDENT_AMBULATORY_CARE_PROVIDER_SITE_OTHER): Payer: Medicaid Other | Admitting: Obstetrics & Gynecology

## 2013-08-15 VITALS — BP 127/83 | HR 93 | Wt 153.0 lb

## 2013-08-15 DIAGNOSIS — Z3400 Encounter for supervision of normal first pregnancy, unspecified trimester: Secondary | ICD-10-CM

## 2013-08-15 NOTE — Progress Notes (Signed)
Initial vitals taken after patient ran into clinic BP 119/91, HR 117.  On relaxed recheck after visit, it was 127/83. No  complaints or concerns.  Fetal movement and labor precautions reviewed. Postdates testing to start next week.  IOL will be scheduled next week if possible.

## 2013-08-15 NOTE — Patient Instructions (Signed)
Return to clinic for any obstetric concerns or go to MAU for evaluation  

## 2013-08-15 NOTE — Progress Notes (Signed)
BP recheck reads 127/83 Pulse:93

## 2013-08-21 ENCOUNTER — Inpatient Hospital Stay (HOSPITAL_COMMUNITY)
Admission: AD | Admit: 2013-08-21 | Discharge: 2013-08-21 | Disposition: A | Discharge status: Home / Self Care | Payer: Medicaid Other | Source: Ambulatory Visit | Attending: Obstetrics & Gynecology | Admitting: Obstetrics & Gynecology

## 2013-08-21 ENCOUNTER — Encounter: Payer: Self-pay | Admitting: Obstetrics & Gynecology

## 2013-08-21 ENCOUNTER — Inpatient Hospital Stay (HOSPITAL_COMMUNITY): Payer: Medicaid Other

## 2013-08-21 ENCOUNTER — Encounter (HOSPITAL_COMMUNITY): Payer: Self-pay | Admitting: *Deleted

## 2013-08-21 ENCOUNTER — Ambulatory Visit (INDEPENDENT_AMBULATORY_CARE_PROVIDER_SITE_OTHER): Payer: Medicaid Other | Admitting: Obstetrics & Gynecology

## 2013-08-21 DIAGNOSIS — Z283 Underimmunization status: Secondary | ICD-10-CM

## 2013-08-21 DIAGNOSIS — O368190 Decreased fetal movements, unspecified trimester, not applicable or unspecified: Secondary | ICD-10-CM | POA: Insufficient documentation

## 2013-08-21 DIAGNOSIS — Z3493 Encounter for supervision of normal pregnancy, unspecified, third trimester: Secondary | ICD-10-CM

## 2013-08-21 DIAGNOSIS — Z3400 Encounter for supervision of normal first pregnancy, unspecified trimester: Secondary | ICD-10-CM

## 2013-08-21 DIAGNOSIS — O9989 Other specified diseases and conditions complicating pregnancy, childbirth and the puerperium: Secondary | ICD-10-CM

## 2013-08-21 DIAGNOSIS — Z2839 Other underimmunization status: Secondary | ICD-10-CM

## 2013-08-21 NOTE — Progress Notes (Signed)
Routine visit. Somewhat decreased FM over the last week. NST reviewed and non reactive in spite of VAS, coca cola and extended time on the monitor. She will go to [facility] for [facility]PP.

## 2013-08-21 NOTE — Discharge Instructions (Signed)
Third Trimester of Pregnancy  The third trimester is from week 29 through week 42, months 7 through 9. The third trimester is a time when the fetus is growing rapidly. At the end of the ninth month, the fetus is about 20 inches in length and weighs 6 10 pounds.   BODY CHANGES  Your body goes through many changes during pregnancy. The changes vary from woman to woman.    Your weight will continue to increase. You can expect to gain 25 35 pounds (11 16 kg) by the end of the pregnancy.   You may begin to get stretch marks on your hips, abdomen, and breasts.   You may urinate more often because the fetus is moving lower into your pelvis and pressing on your bladder.   You may develop or continue to have heartburn as a result of your pregnancy.   You may develop constipation because certain hormones are causing the muscles that push waste through your intestines to slow down.   You may develop hemorrhoids or swollen, bulging veins (varicose veins).   You may have pelvic pain because of the weight gain and pregnancy hormones relaxing your joints between the bones in your pelvis. Back aches may result from over exertion of the muscles supporting your posture.   Your breasts will continue to grow and be tender. A yellow discharge may leak from your breasts called colostrum.   Your belly button may stick out.   You may feel short of breath because of your expanding uterus.   You may notice the fetus "dropping," or moving lower in your abdomen.   You may have a bloody mucus discharge. This usually occurs a few days to a week before labor begins.   Your cervix becomes thin and soft (effaced) near your due date.  WHAT TO EXPECT AT YOUR PRENATAL EXAMS   You will have prenatal exams every 2 weeks until week 36. Then, you will have weekly prenatal exams. During a routine prenatal visit:   You will be weighed to make sure you and the fetus are growing normally.   Your blood pressure is taken.   Your abdomen will be  measured to track your baby's growth.   The fetal heartbeat will be listened to.   Any test results from the previous visit will be discussed.   You may have a cervical check near your due date to see if you have effaced.  At around 36 weeks, your caregiver will check your cervix. At the same time, your caregiver will also perform a test on the secretions of the vaginal tissue. This test is to determine if a type of bacteria, Group B streptococcus, is present. Your caregiver will explain this further.  Your caregiver may ask you:   What your birth plan is.   How you are feeling.   If you are feeling the baby move.   If you have had any abnormal symptoms, such as leaking fluid, bleeding, severe headaches, or abdominal cramping.   If you have any questions.  Other tests or screenings that may be performed during your third trimester include:   Blood tests that check for low iron levels (anemia).   Fetal testing to check the health, activity level, and growth of the fetus. Testing is done if you have certain medical conditions or if there are problems during the pregnancy.  FALSE LABOR  You may feel small, irregular contractions that eventually go away. These are called Braxton Hicks contractions, or   false labor. Contractions may last for hours, days, or even weeks before true labor sets in. If contractions come at regular intervals, intensify, or become painful, it is best to be seen by your caregiver.   SIGNS OF LABOR    Menstrual-like cramps.   Contractions that are 5 minutes apart or less.   Contractions that start on the top of the uterus and spread down to the lower abdomen and back.   A sense of increased pelvic pressure or back pain.   A watery or bloody mucus discharge that comes from the vagina.  If you have any of these signs before the 37th week of pregnancy, call your caregiver right away. You need to go to the hospital to get checked immediately.  HOME CARE INSTRUCTIONS    Avoid all  smoking, herbs, alcohol, and unprescribed drugs. These chemicals affect the formation and growth of the baby.   Follow your caregiver's instructions regarding medicine use. There are medicines that are either safe or unsafe to take during pregnancy.   Exercise only as directed by your caregiver. Experiencing uterine cramps is a good sign to stop exercising.   Continue to eat regular, healthy meals.   Wear a good support bra for breast tenderness.   Do not use hot tubs, steam rooms, or saunas.   Wear your seat belt at all times when driving.   Avoid raw meat, uncooked cheese, cat litter boxes, and soil used by cats. These carry germs that can cause birth defects in the baby.   Take your prenatal vitamins.   Try taking a stool softener (if your caregiver approves) if you develop constipation. Eat more high-fiber foods, such as fresh vegetables or fruit and whole grains. Drink plenty of fluids to keep your urine clear or pale yellow.   Take warm sitz baths to soothe any pain or discomfort caused by hemorrhoids. Use hemorrhoid cream if your caregiver approves.   If you develop varicose veins, wear support hose. Elevate your feet for 15 minutes, 3 4 times a day. Limit salt in your diet.   Avoid heavy lifting, wear low heal shoes, and practice good posture.   Rest a lot with your legs elevated if you have leg cramps or low back pain.   Visit your dentist if you have not gone during your pregnancy. Use a soft toothbrush to brush your teeth and be gentle when you floss.   A sexual relationship may be continued unless your caregiver directs you otherwise.   Do not travel far distances unless it is absolutely necessary and only with the approval of your caregiver.   Take prenatal classes to understand, practice, and ask questions about the labor and delivery.   Make a trial run to the hospital.   Pack your hospital bag.   Prepare the baby's nursery.   Continue to go to all your prenatal visits as directed  by your caregiver.  SEEK MEDICAL CARE IF:   You are unsure if you are in labor or if your water has broken.   You have dizziness.   You have mild pelvic cramps, pelvic pressure, or nagging pain in your abdominal area.   You have persistent nausea, vomiting, or diarrhea.   You have a bad smelling vaginal discharge.   You have pain with urination.  SEEK IMMEDIATE MEDICAL CARE IF:    You have a fever.   You are leaking fluid from your vagina.   You have spotting or bleeding from your vagina.     You have severe abdominal cramping or pain.   You have rapid weight loss or gain.   You have shortness of breath with chest pain.   You notice sudden or extreme swelling of your face, hands, ankles, feet, or legs.   You have not felt your baby move in over an hour.   You have severe headaches that do not go away with medicine.   You have vision changes.  Document Released: 03/17/2001 Document Revised: 11/23/2012 Document Reviewed: 05/24/2012  [employer] Patient Information 2014 [employer].

## 2013-08-24 ENCOUNTER — Inpatient Hospital Stay (HOSPITAL_COMMUNITY)
Admission: AD | Admit: 2013-08-24 | Discharge: 2013-08-24 | Disposition: A | Discharge status: Home / Self Care | Payer: Medicaid Other | Source: Ambulatory Visit | Attending: Obstetrics and Gynecology | Admitting: Obstetrics and Gynecology

## 2013-08-24 ENCOUNTER — Inpatient Hospital Stay (HOSPITAL_COMMUNITY): Payer: Medicaid Other

## 2013-08-24 ENCOUNTER — Encounter (HOSPITAL_COMMUNITY): Payer: Self-pay

## 2013-08-24 ENCOUNTER — Other Ambulatory Visit: Payer: Medicaid Other

## 2013-08-24 ENCOUNTER — Inpatient Hospital Stay (HOSPITAL_COMMUNITY)
Admission: AD | Admit: 2013-08-24 | Discharge: 2013-08-27 | DRG: 765 | Disposition: A | Discharge status: Home / Self Care | Payer: Medicaid Other | Source: Ambulatory Visit | Attending: Obstetrics & Gynecology | Admitting: Obstetrics & Gynecology

## 2013-08-24 ENCOUNTER — Inpatient Hospital Stay (HOSPITAL_COMMUNITY): Payer: Medicaid Other | Admitting: Anesthesiology

## 2013-08-24 ENCOUNTER — Encounter (HOSPITAL_COMMUNITY): Payer: Self-pay | Admitting: *Deleted

## 2013-08-24 ENCOUNTER — Encounter (HOSPITAL_COMMUNITY): Payer: Medicaid Other | Admitting: Anesthesiology

## 2013-08-24 ENCOUNTER — Encounter (HOSPITAL_COMMUNITY)
Admission: AD | Disposition: A | Discharge status: Home / Self Care | Payer: Self-pay | Source: Ambulatory Visit | Attending: Obstetrics & Gynecology

## 2013-08-24 DIAGNOSIS — Z9889 Other specified postprocedural states: Secondary | ICD-10-CM

## 2013-08-24 DIAGNOSIS — Z833 Family history of diabetes mellitus: Secondary | ICD-10-CM

## 2013-08-24 DIAGNOSIS — Z3400 Encounter for supervision of normal first pregnancy, unspecified trimester: Secondary | ICD-10-CM

## 2013-08-24 DIAGNOSIS — O9989 Other specified diseases and conditions complicating pregnancy, childbirth and the puerperium: Principal | ICD-10-CM

## 2013-08-24 DIAGNOSIS — Z283 Underimmunization status: Secondary | ICD-10-CM

## 2013-08-24 DIAGNOSIS — O98519 Other viral diseases complicating pregnancy, unspecified trimester: Secondary | ICD-10-CM | POA: Insufficient documentation

## 2013-08-24 DIAGNOSIS — Z98891 History of uterine scar from previous surgery: Secondary | ICD-10-CM

## 2013-08-24 DIAGNOSIS — O480 Post-term pregnancy: Secondary | ICD-10-CM | POA: Diagnosis present

## 2013-08-24 DIAGNOSIS — IMO0001 Reserved for inherently not codable concepts without codable children: Secondary | ICD-10-CM

## 2013-08-24 DIAGNOSIS — B069 Rubella without complication: Secondary | ICD-10-CM

## 2013-08-24 DIAGNOSIS — O324XX0 Maternal care for high head at term, not applicable or unspecified: Secondary | ICD-10-CM | POA: Diagnosis present

## 2013-08-24 DIAGNOSIS — O09899 Supervision of other high risk pregnancies, unspecified trimester: Secondary | ICD-10-CM

## 2013-08-24 DIAGNOSIS — O411090 Infection of amniotic sac and membranes, unspecified, unspecified trimester, not applicable or unspecified: Secondary | ICD-10-CM | POA: Diagnosis present

## 2013-08-24 LAB — CBC
HCT: 38.2 % (ref 36.0–46.0)
HEMOGLOBIN: 12.9 g/dL (ref 12.0–15.0)
MCH: 29.7 pg (ref 26.0–34.0)
MCHC: 33.8 g/dL (ref 30.0–36.0)
MCV: 88 fL (ref 78.0–100.0)
Platelets: 244 10*3/uL (ref 150–400)
RBC: 4.34 MIL/uL (ref 3.87–5.11)
RDW: 14.9 % (ref 11.5–15.5)
WBC: 16.2 10*3/uL — ABNORMAL HIGH (ref 4.0–10.5)

## 2013-08-24 LAB — RPR

## 2013-08-24 SURGERY — Surgical Case
Anesthesia: Epidural | Site: Abdomen

## 2013-08-24 MED ORDER — CITRIC ACID-SODIUM CITRATE 334-500 MG/5ML PO SOLN
30.0000 mL | ORAL | Status: DC | PRN
  Administered 2013-08-24: 30 mL via ORAL
  Filled 2013-08-24: qty 15

## 2013-08-24 MED ORDER — ACETAMINOPHEN 325 MG PO TABS: 650.0000 mg | ORAL_TABLET | ORAL | Status: DC | PRN

## 2013-08-24 MED ORDER — ONDANSETRON HCL 4 MG/2ML IJ SOLN
INTRAMUSCULAR | Status: AC
  Filled 2013-08-24: qty 2

## 2013-08-24 MED ORDER — LACTATED RINGERS IV SOLN
INTRAVENOUS | Status: DC
  Administered 2013-08-24 (×2): via INTRAUTERINE

## 2013-08-24 MED ORDER — SODIUM CHLORIDE 0.9 % IV SOLN
2.0000 g | Freq: Four times a day (QID) | INTRAVENOUS | Status: DC
  Administered 2013-08-24: 2 g via INTRAVENOUS
  Filled 2013-08-24 (×2): qty 2000

## 2013-08-24 MED ORDER — OXYTOCIN 10 UNIT/ML IJ SOLN
INTRAMUSCULAR | Status: AC
  Filled 2013-08-24: qty 4

## 2013-08-24 MED ORDER — LACTATED RINGERS IV SOLN
INTRAVENOUS | Status: DC
  Administered 2013-08-24: 23:00:00 via INTRAVENOUS

## 2013-08-24 MED ORDER — PHENYLEPHRINE 40 MCG/ML (10ML) SYRINGE FOR IV PUSH (FOR BLOOD PRESSURE SUPPORT)
80.0000 ug | PREFILLED_SYRINGE | INTRAVENOUS | Status: DC | PRN
  Filled 2013-08-24: qty 10

## 2013-08-24 MED ORDER — IBUPROFEN 600 MG PO TABS: 600.0000 mg | ORAL_TABLET | Freq: Four times a day (QID) | ORAL | Status: DC | PRN

## 2013-08-24 MED ORDER — OXYTOCIN 40 UNITS IN LACTATED RINGERS INFUSION - SIMPLE MED
1.0000 m[IU]/min | INTRAVENOUS | Status: DC
  Administered 2013-08-24: 2 m[IU]/min via INTRAVENOUS

## 2013-08-24 MED ORDER — LACTATED RINGERS IV SOLN
500.0000 mL | INTRAVENOUS | Status: DC | PRN
  Administered 2013-08-24: 500 mL via INTRAVENOUS

## 2013-08-24 MED ORDER — LACTATED RINGERS IV SOLN
500.0000 mL | Freq: Once | INTRAVENOUS | Status: AC
  Administered 2013-08-24: 500 mL via INTRAVENOUS

## 2013-08-24 MED ORDER — TERBUTALINE SULFATE 1 MG/ML IJ SOLN: 0.2500 mg | Freq: Once | INTRAMUSCULAR | Status: AC | PRN

## 2013-08-24 MED ORDER — FENTANYL 2.5 MCG/ML BUPIVACAINE 1/10 % EPIDURAL INFUSION (WH - ANES)
14.0000 mL/h | INTRAMUSCULAR | Status: DC | PRN
  Administered 2013-08-24 (×2): 14 mL/h via EPIDURAL
  Filled 2013-08-24 (×2): qty 125

## 2013-08-24 MED ORDER — FENTANYL CITRATE 0.05 MG/ML IJ SOLN
100.0000 ug | INTRAMUSCULAR | Status: DC | PRN
  Administered 2013-08-24: 100 ug via INTRAVENOUS
  Filled 2013-08-24: qty 2

## 2013-08-24 MED ORDER — LIDOCAINE HCL (PF) 1 % IJ SOLN
INTRAMUSCULAR | Status: DC | PRN
  Administered 2013-08-24 (×2): 5 mL

## 2013-08-24 MED ORDER — MORPHINE SULFATE 0.5 MG/ML IJ SOLN
INTRAMUSCULAR | Status: AC
  Filled 2013-08-24: qty 10

## 2013-08-24 MED ORDER — OXYCODONE-ACETAMINOPHEN 5-325 MG PO TABS: 1.0000 | ORAL_TABLET | ORAL | Status: DC | PRN

## 2013-08-24 MED ORDER — PHENYLEPHRINE 40 MCG/ML (10ML) SYRINGE FOR IV PUSH (FOR BLOOD PRESSURE SUPPORT): 80.0000 ug | PREFILLED_SYRINGE | INTRAVENOUS | Status: DC | PRN

## 2013-08-24 MED ORDER — OXYTOCIN 40 UNITS IN LACTATED RINGERS INFUSION - SIMPLE MED
62.5000 mL/h | INTRAVENOUS | Status: DC
  Filled 2013-08-24: qty 1000

## 2013-08-24 MED ORDER — EPHEDRINE 5 MG/ML INJ: 10.0000 mg | INTRAVENOUS | Status: DC | PRN

## 2013-08-24 MED ORDER — DIPHENHYDRAMINE HCL 50 MG/ML IJ SOLN: 12.5000 mg | INTRAMUSCULAR | Status: DC | PRN

## 2013-08-24 MED ORDER — FLEET ENEMA 7-19 GM/118ML RE ENEM: 1.0000 | ENEMA | RECTAL | Status: DC | PRN

## 2013-08-24 MED ORDER — DEXTROSE 5 % IV SOLN
140.0000 mg | Freq: Three times a day (TID) | INTRAVENOUS | Status: DC
  Administered 2013-08-24: 140 mg via INTRAVENOUS
  Filled 2013-08-24 (×3): qty 3.5

## 2013-08-24 MED ORDER — BUPIVACAINE HCL (PF) 0.5 % IJ SOLN
INTRAMUSCULAR | Status: AC
  Filled 2013-08-24: qty 30

## 2013-08-24 MED ORDER — ACETAMINOPHEN 500 MG PO TABS
1000.0000 mg | ORAL_TABLET | Freq: Four times a day (QID) | ORAL | Status: DC | PRN
  Administered 2013-08-24: 1000 mg via ORAL
  Filled 2013-08-24: qty 2

## 2013-08-24 MED ORDER — BUPIVACAINE HCL (PF) 0.5 % IJ SOLN
INTRAMUSCULAR | Status: DC | PRN
  Administered 2013-08-24: 30 mL

## 2013-08-24 MED ORDER — LIDOCAINE HCL (PF) 1 % IJ SOLN: 30.0000 mL | INTRAMUSCULAR | Status: DC | PRN

## 2013-08-24 MED ORDER — EPHEDRINE 5 MG/ML INJ
10.0000 mg | INTRAVENOUS | Status: DC | PRN
  Filled 2013-08-24: qty 4

## 2013-08-24 MED ORDER — OXYTOCIN BOLUS FROM INFUSION: 500.0000 mL | INTRAVENOUS | Status: DC

## 2013-08-24 MED ORDER — ONDANSETRON HCL 4 MG/2ML IJ SOLN: 4.0000 mg | Freq: Four times a day (QID) | INTRAMUSCULAR | Status: DC | PRN

## 2013-08-24 MED ORDER — SODIUM BICARBONATE 8.4 % IV SOLN
INTRAVENOUS | Status: DC | PRN
  Administered 2013-08-24: 2 mL via EPIDURAL
  Administered 2013-08-24: 5 mL via EPIDURAL
  Administered 2013-08-24 (×2): 4 mL via EPIDURAL
  Administered 2013-08-24: 2 mL via EPIDURAL

## 2013-08-24 SURGICAL SUPPLY — 37 items
BARRIER ADHS 3X4 INTERCEED (GAUZE/BANDAGES/DRESSINGS) IMPLANT
BENZOIN TINCTURE PRP APPL 2/3 (GAUZE/BANDAGES/DRESSINGS) ×3 IMPLANT
CLAMP CORD UMBIL (MISCELLANEOUS) IMPLANT
CLOSURE WOUND 1/2 X4 (GAUZE/BANDAGES/DRESSINGS) ×1
CLOTH BEACON ORANGE TIMEOUT ST (SAFETY) ×3 IMPLANT
CONTAINER PREFILL 10% NBF 15ML (MISCELLANEOUS) IMPLANT
DRAPE LG THREE QUARTER DISP (DRAPES) IMPLANT
DRSG OPSITE POSTOP 4X10 (GAUZE/BANDAGES/DRESSINGS) ×3 IMPLANT
DURAPREP 26ML APPLICATOR (WOUND CARE) ×3 IMPLANT
ELECT REM PT RETURN 9FT ADLT (ELECTROSURGICAL) ×3
ELECTRODE REM PT RTRN 9FT ADLT (ELECTROSURGICAL) ×1 IMPLANT
EXTRACTOR VACUUM KIWI (MISCELLANEOUS) IMPLANT
GLOVE BIO SURGEON STRL SZ 6.5 (GLOVE) ×2 IMPLANT
GLOVE BIO SURGEONS STRL SZ 6.5 (GLOVE) ×1
GOWN STRL REUS W/TWL LRG LVL3 (GOWN DISPOSABLE) ×6 IMPLANT
KIT ABG SYR 3ML LUER SLIP (SYRINGE) IMPLANT
NEEDLE HYPO 25X5/8 SAFETYGLIDE (NEEDLE) IMPLANT
NEEDLE SPNL 18GX3.5 QUINCKE PK (NEEDLE) ×3 IMPLANT
NS IRRIG 1000ML POUR BTL (IV SOLUTION) ×3 IMPLANT
PACK C SECTION WH (CUSTOM PROCEDURE TRAY) ×3 IMPLANT
PAD OB MATERNITY 4.3X12.25 (PERSONAL CARE ITEMS) ×3 IMPLANT
STRIP CLOSURE SKIN 1/2X4 (GAUZE/BANDAGES/DRESSINGS) ×2 IMPLANT
SUT PDS AB 0 CTX 60 (SUTURE) IMPLANT
SUT VIC AB 0 CT1 27 (SUTURE)
SUT VIC AB 0 CT1 27XBRD ANBCTR (SUTURE) IMPLANT
SUT VIC AB 0 CT1 36 (SUTURE) ×3 IMPLANT
SUT VIC AB 2-0 CT1 27 (SUTURE) ×2
SUT VIC AB 2-0 CT1 TAPERPNT 27 (SUTURE) ×1 IMPLANT
SUT VIC AB 2-0 CTX 36 (SUTURE) ×6 IMPLANT
SUT VIC AB 3-0 CT1 27 (SUTURE) ×2
SUT VIC AB 3-0 CT1 TAPERPNT 27 (SUTURE) ×1 IMPLANT
SUT VIC AB 3-0 SH 27 (SUTURE)
SUT VIC AB 3-0 SH 27X BRD (SUTURE) IMPLANT
SYR 30ML LL (SYRINGE) ×3 IMPLANT
TOWEL OR 17X24 6PK STRL BLUE (TOWEL DISPOSABLE) ×3 IMPLANT
TRAY FOLEY CATH 14FR (SET/KITS/TRAYS/PACK) IMPLANT
WATER STERILE IRR 1000ML POUR (IV SOLUTION) IMPLANT

## 2013-08-24 NOTE — H&P (Signed)
LABOR ADMISSION HISTORY AND PHYSICAL  Emily Mack is a 23 y.o. female G1P0 with IUP at 5374w3d presenting for IOL. She was seen in the MAU earlier this morning for increased contractions.  Cervix was 1-2 cm so she was discharged and upon going home she had increased intensity of contractions. She presented back to the MAU with cervix 3 cm and sent straight back to labor and delivery. NST (5/18) was non reactive in spite of VAS and [employer] so evaluated with BPP. On (5/21) BPP 8/8 with normal fetus.   [facility]  Prenatal History/Complications: Uncomplicated prenatal course  Past Medical History: Past Medical History  Diagnosis Date  . Medical history non-contributory     Past Surgical History: Past Surgical History  Procedure Laterality Date  . Tympanoplasty      age 89    Obstetrical History: OB History   Grav Para Term Preterm Abortions TAB SAB Ect Mult Living   1         0      Social History: History   Social History  . Marital Status: Married    Spouse Name: N/A    Number of Children: N/A  . Years of Education: N/A   Social History Main Topics  . Smoking status: Never Smoker   . Smokeless tobacco: Never Used  . Alcohol Use: No  . Drug Use: No  . Sexual Activity: Yes   Other Topics Concern  . None   Social History Narrative  . None    Family History: Family History  Problem Relation Age of Onset  . Diabetes Mother     Allergies: No Known Allergies  Prescriptions prior to admission  Medication Sig Dispense Refill  . acetaminophen (TYLENOL) 500 MG tablet Take 1,000 mg by mouth once.      . calcium carbonate (TUMS - DOSED IN MG ELEMENTAL CALCIUM) 500 MG chewable tablet Chew 2 tablets by mouth daily as needed for indigestion or heartburn.       . Prenatal Vit-Fe Fumarate-FA (PRENATAL MULTIVITAMIN) TABS tablet Take 1 tablet by mouth daily at 12 noon.         Review of Systems   All systems reviewed and negative except  as stated in HPI  Blood pressure 131/70, pulse 92, temperature 98.5 F (36.9 C), temperature source Oral, resp. rate 18, height 4\' 10"  (1.473 m), weight 69.4 kg (153 lb), last menstrual period 11/14/2012. General appearance: alert, cooperative and no distress Lungs: clear to auscultation bilaterally Heart: regular rate and rhythm Abdomen: soft, non-tender; bowel sounds normal Extremities: Homans sign is negative, no sign of DVT Presentation: cephalic Fetal monitoringBaseline: 145 bpm, Variability: Fair (1-6 bpm), Accelerations: Non-reactive but appropriate for gestational age and Decelerations: Absent Uterine activity: 2-4 minutes.  Dilation: 3 Effacement (%): 100 Station: -1;-2 Exam by:: K.WIlson,RN   Prenatal labs: ABO, Rh: A/POS/-- (01/21 1107) Antibody: NEG (01/21 1107) Rubella:   RPR: NON REAC (02/27 0932)  HBsAg: NEGATIVE (01/21 1107)  HIV: NON REACTIVE (02/27 0932)  GBS: Negative (04/22 0000)  1 hr Glucola 95 Genetic screening  1 Screen:  WNL    AFP:    WNL   Anatomy US Normal    Prenatal Transfer Tool  Maternal Diabetes: No Genetic Screening: Normal Maternal Ultrasounds/Referrals: Normal Fetal Ultrasounds or other Referrals:  None Maternal Substance Abuse:  No Significant Maternal Medications:  None Significant Maternal Lab Results: Lab values include: Group B Strep negative     Results for orders  placed during the hospital encounter of 08/24/13 (from the past 24 hour(s))  CBC   Collection Time    08/24/13  7:30 AM      Result Value Ref Range   WBC 16.2 (*) 4.0 - 10.5 K/uL   RBC 4.34  3.87 - 5.11 MIL/uL   Hemoglobin 12.9  12.0 - 15.0 g/dL   HCT 21.338.2  08.636.0 - 57.846.0 %   MCV 88.0  78.0 - 100.0 fL   MCH 29.7  26.0 - 34.0 pg   MCHC 33.8  30.0 - 36.0 g/dL   RDW 46.914.9  62.911.5 - 52.815.5 %   Platelets 244  150 - 400 K/uL    [facility]  Dating LMP/Ultrasound:7  weeks        Ultrasound consistent with LMP: Yes  Genetic Screen 1 Screen:  WNL                AFP:    WNL                  Anatomic US  Normal  GTT Third trimester: 95  TDaP vaccine  2/17  Flu vaccine declines  GBS Negative  Baby Food Breast  Contraception None due to religion  Circumcision Yes  Pediatrician Undecided  Support Person: Husband   Assessment: Emily Mack is a 23 y.o. G1P0 at 3861w3d here for IOL for post dates.    #Labor: progressing, NSVD #Pain: Fentanyl  #FWB: Category 1  #ID:  GBS neg #MOF: breast  #MOC: none due to religion  #Circ:  Yes   Myra RudeJeremy E Schmitz 08/24/2013, 9:16 AM  I spoke with and examined patient and agree with resident's note and plan of care.  Tawana ScaleMichael Ryan Carlina Derks, MD OB Fellow 08/24/2013 9:56 AM

## 2013-08-24 NOTE — Progress Notes (Signed)
.     Emily Mack is a 23 y.o. G1P0 at 6051w3d  admitted for active labor  Subjective: Comfortable with epidural  Objective: Filed Vitals:   08/24/13 1830 08/24/13 1859 08/24/13 1930 08/24/13 2000  BP: 126/82 133/81 129/61 137/78  Pulse: 110 95 97 97  Temp:    99.3 F (37.4 C)  TempSrc:    Oral  Resp:  18 18 18   Height:      Weight:          FHT:  FHR: 160 bpm, variability: moderate,  accelerations:  Abscent,  decelerations:  Present Deep variables during contractions UC:   irregular, every 2-4 minutes SVE:   Dilation: 8 Effacement (%): 100 Station: -2 Exam by:: Dr. Marice Potterove IUPC placed and amnioinfusion started  Labs: Lab Results  Component Value Date   WBC 16.2* 08/24/2013   HGB 12.9 08/24/2013   HCT 38.2 08/24/2013   MCV 88.0 08/24/2013   PLT 244 08/24/2013    Assessment / Plan: Spontaneous labor, progressing normally  Labor: Progressing normally Fetal Wellbeing:  Category I Pain Control:  Epidural Anticipated MOD:  NSVD  Emily Mack 08/24/2013, 8:17 PM

## 2013-08-24 NOTE — Progress Notes (Signed)
ANTIBIOTIC CONSULT NOTE - INITIAL  Pharmacy Consult for Gentamicin Indication: Chorioamnionitis   No Known Allergies  Patient Measurements: Height: 4\' 10"  (147.3 cm) Weight: 153 lb (69.4 kg) IBW/kg (Calculated) : 40.9 Adjusted Body Weight: 52.7 kg  Vital Signs: Temp: 100.2 F (37.9 C) (05/21 1659) Temp src: Oral (05/21 1659) BP: 131/83 mmHg (05/21 1659) Pulse Rate: 105 (05/21 1659)  Labs:  Recent Labs  08/24/13 0730  WBC 16.2*  HGB 12.9  PLT 244   No results found for this basename: GENTTROUGH, GENTPEAK, GENTRANDOM,  in the last 72 hours   Microbiology: Recent Results (from the past 720 hour(s))  OB RESULTS CONSOLE GBS     Status: None   Collection Time    07/26/13 12:00 AM      Result Value Ref Range Status   GBS Negative   Final    Medications:  Ampicillin 2g IV q6h  Assessment: 23 y.o. female G1P0 at 2218w3d  Estimated Ke = 0.311, Vd = 21.1 L  Goal of Therapy:  Gentamicin peak 6-8 mg/L and Trough < 1 mg/L  Plan:  Gentamicin 140 mg IV every 8 hrs  Check Scr with next labs if gentamicin continued. Will check gentamicin levels if continued > 72hr or clinically indicated.  Gildardo CrankerAlison Lydia Merlen Gurry 08/24/2013,5:44 PM

## 2013-08-24 NOTE — Progress Notes (Signed)
Emily Mack is a 23 y.o. G1P0 at 3377w3d  admitted for active labor  Subjective:  Comfortable with epidrual no complaints, maternal fever Objective: BP 131/83  Pulse 105  Temp(Src) 100.2 F (37.9 C) (Oral)  Resp 18  Ht 4\' 10"  (1.473 m)  Wt 69.4 kg (153 lb)  BMI 31.99 kg/m2  LMP 11/14/2012   Total I/O In: -  Out: 800 [Urine:800]  FHT:  FHR: 160s bpm, variability: minimal,  accelerations:  absent,  decelerations:  occ variable UC:   regular, every 2-3 minutes SVE:   Dilation: 6 Effacement (%): 80 Station: -3 Exam by:: Dr Ike Benedom, Dr Debroah LoopArnold at bedside  Labs: Lab Results  Component Value Date   WBC 16.2* 08/24/2013   HGB 12.9 08/24/2013   HCT 38.2 08/24/2013   MCV 88.0 08/24/2013   PLT 244 08/24/2013    Assessment / Plan: Spontaneous labor, progressing normally, s/p AROM @ 1600  Labor: pt s/p AROM with pudendal needle Preeclampsia:  no signs or symptoms of toxicity Fetal Wellbeing:  Category II, Chorio Pain Control:  Epidural I/D:  Amp/Gent, APAP Anticipated MOD:  NSVD  Minta BalsamMichael R Nic Lampe 08/24/2013, 5:45 PM

## 2013-08-24 NOTE — Progress Notes (Signed)
Emily Mack is a 23 y.o. G1P0 at 4267w3d  admitted for active labor  Subjective:  Comfortable with epidrual no complaints Objective: BP 105/62  Pulse 107  Temp(Src) 98.5 F (36.9 C) (Oral)  Resp 18  Ht 4\' 10"  (1.473 m)  Wt 69.4 kg (153 lb)  BMI 31.99 kg/m2  LMP 11/14/2012      FHT:  FHR: 150s bpm, variability: moderate,  accelerations:  absent,  decelerations:  Absent UC:   regular, every 2-3 minutes SVE:   Dilation: 6 Effacement (%): 80 Station: Ballotable Exam by:: dr Stassi Fadely  Labs: Lab Results  Component Value Date   WBC 16.2* 08/24/2013   HGB 12.9 08/24/2013   HCT 38.2 08/24/2013   MCV 88.0 08/24/2013   PLT 244 08/24/2013    Assessment / Plan: Spontaneous labor, progressing normally  Labor: Progressing normally and will AROM when able Preeclampsia:  no signs or symptoms of toxicity Fetal Wellbeing:  Category I Pain Control:  Epidural I/D:  n/a Anticipated MOD:  NSVD  Emily Mack 08/24/2013, 1:42 PM

## 2013-08-24 NOTE — Anesthesia Preprocedure Evaluation (Addendum)
Anesthesia Evaluation  Patient identified by MRN, date of birth, ID band Patient awake    Reviewed: Allergy & Precautions, H&P , NPO status , Patient's Chart, lab work & pertinent test results  Airway Mallampati: II TM Distance: >3 FB Neck ROM: full    Dental   Pulmonary  breath sounds clear to auscultation        Cardiovascular Rhythm:regular Rate:Normal     Neuro/Psych    GI/Hepatic   Endo/Other    Renal/GU      Musculoskeletal   Abdominal   Peds  Hematology   Anesthesia Other Findings   Reproductive/Obstetrics (+) Pregnancy                          Anesthesia Physical Anesthesia Plan  ASA: II and emergent  Anesthesia Plan: Epidural   Post-op Pain Management:    Induction:   Airway Management Planned: Natural Airway  Additional Equipment:   Intra-op Plan:   Post-operative Plan:   Informed Consent: I have reviewed the patients History and Physical, chart, labs and discussed the procedure including the risks, benefits and alternatives for the proposed anesthesia with the patient or authorized representative who has indicated his/her understanding and acceptance.     Plan Discussed with: Anesthesiologist, CRNA and Surgeon  Anesthesia Plan Comments:        Anesthesia Quick Evaluation

## 2013-08-24 NOTE — Anesthesia Procedure Notes (Signed)
Epidural Patient location during procedure: OB Start time: 08/24/2013 11:46 AM  Staffing Anesthesiologist: Brayton CavesJACKSON, Bunny Lowdermilk Performed by: anesthesiologist   Preanesthetic Checklist Completed: patient identified, site marked, surgical consent, pre-op evaluation, timeout performed, IV checked, risks and benefits discussed and monitors and equipment checked  Epidural Patient position: sitting Prep: site prepped and draped and DuraPrep Patient monitoring: continuous pulse ox and blood pressure Approach: midline Location: L3-L4 Injection technique: LOR air  Needle:  Needle type: Tuohy  Needle gauge: 17 G Needle length: 9 cm and 9 Needle insertion depth: 5 cm cm Catheter type: closed end flexible Catheter size: 19 Gauge Catheter at skin depth: 10 cm Test dose: negative  Assessment Events: blood not aspirated, injection not painful, no injection resistance, negative IV test and no paresthesia  Additional Notes Patient identified.  Risk benefits discussed including failed block, incomplete pain control, headache, nerve damage, paralysis, blood pressure changes, nausea, vomiting, reactions to medication both toxic or allergic, and postpartum back pain.  Patient expressed understanding and wished to proceed.  All questions were answered.  Sterile technique used throughout procedure and epidural site dressed with sterile barrier dressing. No paresthesia or other complications noted.The patient did not experience any signs of intravascular injection such as tinnitus or metallic taste in mouth nor signs of intrathecal spread such as rapid motor block. Please see nursing notes for vital signs.

## 2013-08-24 NOTE — Progress Notes (Signed)
   Emily Mack is a 23 y.o. G1P0 at 8345w3d  admitted for active labor  Subjective: No pressure  Objective: Filed Vitals:   08/24/13 2130 08/24/13 2200 08/24/13 2229 08/24/13 2300  BP: 117/53 110/49 105/72 104/57  Pulse: 90 92 101 99  Temp:      TempSrc:      Resp: 18 18 18 18   Height:      Weight:          FHT:  FHR: 160 bpm, variability: minimal ,  accelerations:  Abscent,  decelerations:  Present rare variable decel now UC:   regular, every 2-3 minutes SVE:   Dilation: 8 Effacement (%): 100 Station: -2 Exam by:: F. Cresenzo-Dishmon, CNM Pitocin @ 3 mu/min  Labs: Lab Results  Component Value Date   WBC 16.2* 08/24/2013   HGB 12.9 08/24/2013   HCT 38.2 08/24/2013   MCV 88.0 08/24/2013   PLT 244 08/24/2013    Assessment / Plan: protracted labor   Labor: protracted Fetal Wellbeing:  Category II Pain Control:  Epidural Anticipated MOD:  C/S  Jacklyn ShellFrances Cresenzo-Dishmon 08/24/2013, 11:16 PM

## 2013-08-24 NOTE — Progress Notes (Signed)
Drenda FreezeFran Cres-Dishmon, CNM notified of FHR tracing with variable decels, interventions provided, SVe, mat temp, fetal tachycardia, abx admin. Provider reviewing tracing on way to evaluate.

## 2013-08-24 NOTE — MAU Note (Signed)
PT BROUGHT FROM  [facility]  WITH W/C  TO RM 10-  SAYS  STARTEDI HURTING BAD AT 2330.  GETS [facility].    MON VE 1-2 CM.   DENIES HSV  AND  SAYS HAD MRSA  IN 2008-  HAD SORE ON LEFT LEG.

## 2013-08-24 NOTE — MAU Note (Signed)
Ctx stronger closer together pt very uncomfortable brought straight back to room.

## 2013-08-24 NOTE — Progress Notes (Signed)
   Emily Mack is a 23 y.o. G1P0 at 2328w3d  admitted for active labor  Subjective: Comfortable with epidural  Objective: Filed Vitals:   08/24/13 1830 08/24/13 1859 08/24/13 1930 08/24/13 2000  BP: 126/82 133/81 129/61 137/78  Pulse: 110 95 97 97  Temp:    99.3 F (37.4 C)  TempSrc:    Oral  Resp:  18 18 18   Height:      Weight:          FHT:  FHR: 150 bpm, variability: minimal ,  accelerations:  Abscent,  decelerations:  Present intermittent deep variables with contractions,  UC:   MVU's ~ 200 SVE:   Dilation: 8 Effacement (%): 100 Station: -2 Exam by:: Dr. Marice Potterove   Labs: Lab Results  Component Value Date   WBC 16.2* 08/24/2013   HGB 12.9 08/24/2013   HCT 38.2 08/24/2013   MCV 88.0 08/24/2013   PLT 244 08/24/2013    Assessment / Plan: Spontaneous labor, progressing normally Continue amnioinfusion, position changes Start O2 Dr Marice Potterove in to assess  Labor: Progressing normally Fetal Wellbeing:  Category II Pain Control:  Epidural Anticipated MOD:  NSVD  Emily Mack 08/24/2013, 8:24 PM

## 2013-08-25 ENCOUNTER — Encounter (HOSPITAL_COMMUNITY): Payer: Self-pay | Admitting: *Deleted

## 2013-08-25 DIAGNOSIS — O411090 Infection of amniotic sac and membranes, unspecified, unspecified trimester, not applicable or unspecified: Secondary | ICD-10-CM

## 2013-08-25 DIAGNOSIS — Z9889 Other specified postprocedural states: Secondary | ICD-10-CM

## 2013-08-25 LAB — BASIC METABOLIC PANEL
BUN: 5 mg/dL — ABNORMAL LOW (ref 6–23)
CHLORIDE: 101 meq/L (ref 96–112)
CO2: 23 mEq/L (ref 19–32)
CREATININE: 0.62 mg/dL (ref 0.50–1.10)
Calcium: 8.9 mg/dL (ref 8.4–10.5)
GFR calc non Af Amer: >90 mL/min (ref >90)
Glucose, Bld: 109 mg/dL — ABNORMAL HIGH (ref 70–99)
Potassium: 4.5 mEq/L (ref 3.7–5.3)
SODIUM: 136 meq/L — AB (ref 137–147)

## 2013-08-25 LAB — CBC
HCT: 33.2 % — ABNORMAL LOW (ref 36.0–46.0)
HEMOGLOBIN: 11 g/dL — AB (ref 12.0–15.0)
MCH: 29.5 pg (ref 26.0–34.0)
MCHC: 33.1 g/dL (ref 30.0–36.0)
MCV: 89 fL (ref 78.0–100.0)
PLATELETS: 207 10*3/uL (ref 150–400)
RBC: 3.73 MIL/uL — AB (ref 3.87–5.11)
RDW: 15.2 % (ref 11.5–15.5)
WBC: 18.2 10*3/uL — AB (ref 4.0–10.5)

## 2013-08-25 MED ORDER — GENTAMICIN SULFATE 40 MG/ML IJ SOLN
Freq: Three times a day (TID) | INTRAVENOUS | Status: AC
  Administered 2013-08-25 (×2): via INTRAVENOUS
  Filled 2013-08-25 (×2): qty 3

## 2013-08-25 MED ORDER — LACTATED RINGERS IV SOLN
INTRAVENOUS | Status: DC | PRN
  Administered 2013-08-25: via INTRAVENOUS

## 2013-08-25 MED ORDER — GENTAMICIN SULFATE 40 MG/ML IJ SOLN
Freq: Once | INTRAVENOUS | Status: AC
  Administered 2013-08-25: 01:00:00 via INTRAVENOUS
  Filled 2013-08-25: qty 3.25

## 2013-08-25 MED ORDER — IBUPROFEN 600 MG PO TABS
600.0000 mg | ORAL_TABLET | Freq: Four times a day (QID) | ORAL | Status: DC
  Administered 2013-08-25 – 2013-08-27 (×9): 600 mg via ORAL
  Filled 2013-08-25 (×9): qty 1

## 2013-08-25 MED ORDER — OXYCODONE-ACETAMINOPHEN 5-325 MG PO TABS
1.0000 | ORAL_TABLET | ORAL | Status: DC | PRN
  Administered 2013-08-26 – 2013-08-27 (×3): 1 via ORAL
  Filled 2013-08-25 (×3): qty 1

## 2013-08-25 MED ORDER — DIPHENHYDRAMINE HCL 50 MG/ML IJ SOLN: 12.5000 mg | INTRAMUSCULAR | Status: DC | PRN

## 2013-08-25 MED ORDER — METOCLOPRAMIDE HCL 5 MG/ML IJ SOLN: 10.0000 mg | Freq: Three times a day (TID) | INTRAMUSCULAR | Status: DC | PRN

## 2013-08-25 MED ORDER — ZOLPIDEM TARTRATE 5 MG PO TABS: 5.0000 mg | ORAL_TABLET | Freq: Every evening | ORAL | Status: DC | PRN

## 2013-08-25 MED ORDER — ONDANSETRON HCL 4 MG PO TABS: 4.0000 mg | ORAL_TABLET | ORAL | Status: DC | PRN

## 2013-08-25 MED ORDER — PRENATAL MULTIVITAMIN CH
1.0000 | ORAL_TABLET | Freq: Every day | ORAL | Status: DC
  Administered 2013-08-25 – 2013-08-26 (×2): 1 via ORAL
  Filled 2013-08-25 (×2): qty 1

## 2013-08-25 MED ORDER — SCOPOLAMINE 1 MG/3DAYS TD PT72
MEDICATED_PATCH | TRANSDERMAL | Status: AC
  Filled 2013-08-25: qty 1

## 2013-08-25 MED ORDER — ONDANSETRON HCL 4 MG/2ML IJ SOLN: 4.0000 mg | Freq: Three times a day (TID) | INTRAMUSCULAR | Status: DC | PRN

## 2013-08-25 MED ORDER — KETOROLAC TROMETHAMINE 30 MG/ML IJ SOLN
30.0000 mg | Freq: Four times a day (QID) | INTRAMUSCULAR | Status: AC | PRN
  Administered 2013-08-25: 30 mg via INTRAVENOUS

## 2013-08-25 MED ORDER — OXYTOCIN 40 UNITS IN LACTATED RINGERS INFUSION - SIMPLE MED: 62.5000 mL/h | INTRAVENOUS | Status: AC

## 2013-08-25 MED ORDER — MEPERIDINE HCL 25 MG/ML IJ SOLN
INTRAMUSCULAR | Status: DC | PRN
  Administered 2013-08-25 (×3): 6 mg via INTRAVENOUS
  Administered 2013-08-25: 7 mg via INTRAVENOUS

## 2013-08-25 MED ORDER — SIMETHICONE 80 MG PO CHEW
80.0000 mg | CHEWABLE_TABLET | Freq: Three times a day (TID) | ORAL | Status: DC
  Administered 2013-08-25 – 2013-08-26 (×5): 80 mg via ORAL
  Filled 2013-08-25 (×5): qty 1

## 2013-08-25 MED ORDER — ONDANSETRON HCL 4 MG/2ML IJ SOLN
INTRAMUSCULAR | Status: DC | PRN
  Administered 2013-08-25: 4 mg via INTRAVENOUS

## 2013-08-25 MED ORDER — SODIUM CHLORIDE 0.9 % IJ SOLN: 3.0000 mL | INTRAMUSCULAR | Status: DC | PRN

## 2013-08-25 MED ORDER — CEFAZOLIN SODIUM-DEXTROSE 2-3 GM-% IV SOLR
INTRAVENOUS | Status: DC | PRN
  Administered 2013-08-24: 2 g via INTRAVENOUS

## 2013-08-25 MED ORDER — DIPHENHYDRAMINE HCL 25 MG PO CAPS: 25.0000 mg | ORAL_CAPSULE | ORAL | Status: DC | PRN

## 2013-08-25 MED ORDER — MEPERIDINE HCL 25 MG/ML IJ SOLN: 6.2500 mg | INTRAMUSCULAR | Status: DC | PRN

## 2013-08-25 MED ORDER — NALOXONE HCL 0.4 MG/ML IJ SOLN
0.4000 mg | INTRAMUSCULAR | Status: DC | PRN
Start: 2013-08-25 — End: 2013-08-27

## 2013-08-25 MED ORDER — MORPHINE SULFATE (PF) 0.5 MG/ML IJ SOLN
INTRAMUSCULAR | Status: DC | PRN
  Administered 2013-08-24: 1 mg via INTRAVENOUS

## 2013-08-25 MED ORDER — WITCH HAZEL-GLYCERIN EX PADS: 1.0000 "application " | MEDICATED_PAD | CUTANEOUS | Status: DC | PRN

## 2013-08-25 MED ORDER — MORPHINE SULFATE (PF) 0.5 MG/ML IJ SOLN
INTRAMUSCULAR | Status: DC | PRN
  Administered 2013-08-24: 4 mg via EPIDURAL

## 2013-08-25 MED ORDER — SIMETHICONE 80 MG PO CHEW: 80.0000 mg | CHEWABLE_TABLET | ORAL | Status: DC | PRN

## 2013-08-25 MED ORDER — DIPHENHYDRAMINE HCL 25 MG PO CAPS: 25.0000 mg | ORAL_CAPSULE | Freq: Four times a day (QID) | ORAL | Status: DC | PRN

## 2013-08-25 MED ORDER — MEPERIDINE HCL 25 MG/ML IJ SOLN
INTRAMUSCULAR | Status: AC
  Filled 2013-08-25: qty 1

## 2013-08-25 MED ORDER — ONDANSETRON HCL 4 MG/2ML IJ SOLN: 4.0000 mg | INTRAMUSCULAR | Status: DC | PRN

## 2013-08-25 MED ORDER — SENNOSIDES-DOCUSATE SODIUM 8.6-50 MG PO TABS
2.0000 | ORAL_TABLET | ORAL | Status: DC
  Administered 2013-08-26 – 2013-08-27 (×2): 2 via ORAL
  Filled 2013-08-25 (×2): qty 2

## 2013-08-25 MED ORDER — TETANUS-DIPHTH-ACELL PERTUSSIS 5-2.5-18.5 LF-MCG/0.5 IM SUSP: 0.5000 mL | Freq: Once | INTRAMUSCULAR | Status: DC

## 2013-08-25 MED ORDER — CEFAZOLIN SODIUM-DEXTROSE 2-3 GM-% IV SOLR
INTRAVENOUS | Status: AC
  Filled 2013-08-25: qty 50

## 2013-08-25 MED ORDER — KETOROLAC TROMETHAMINE 30 MG/ML IJ SOLN: 30.0000 mg | Freq: Four times a day (QID) | INTRAMUSCULAR | Status: AC | PRN

## 2013-08-25 MED ORDER — NALOXONE HCL 1 MG/ML IJ SOLN
1.0000 ug/kg/h | INTRAVENOUS | Status: DC | PRN
  Filled 2013-08-25: qty 2

## 2013-08-25 MED ORDER — NALBUPHINE HCL 10 MG/ML IJ SOLN: 5.0000 mg | INTRAMUSCULAR | Status: DC | PRN

## 2013-08-25 MED ORDER — SCOPOLAMINE 1 MG/3DAYS TD PT72
1.0000 | MEDICATED_PATCH | Freq: Once | TRANSDERMAL | Status: DC
  Administered 2013-08-25: 1.5 mg via TRANSDERMAL

## 2013-08-25 MED ORDER — CLINDAMYCIN PHOSPHATE 900 MG/50ML IV SOLN
900.0000 mg | Freq: Three times a day (TID) | INTRAVENOUS | Status: DC
Start: 2013-08-25 — End: 2013-08-25

## 2013-08-25 MED ORDER — NALBUPHINE HCL 10 MG/ML IJ SOLN
5.0000 mg | INTRAMUSCULAR | Status: DC | PRN
Start: 2013-08-25 — End: 2013-08-27

## 2013-08-25 MED ORDER — LACTATED RINGERS IV SOLN
40.0000 [IU] | INTRAVENOUS | Status: DC | PRN
  Administered 2013-08-24: 40 [IU] via INTRAVENOUS

## 2013-08-25 MED ORDER — LIDOCAINE-EPINEPHRINE (PF) 2 %-1:200000 IJ SOLN
INTRAMUSCULAR | Status: AC
  Filled 2013-08-25: qty 20

## 2013-08-25 MED ORDER — KETOROLAC TROMETHAMINE 30 MG/ML IJ SOLN
INTRAMUSCULAR | Status: AC
  Filled 2013-08-25: qty 1

## 2013-08-25 MED ORDER — LANOLIN HYDROUS EX OINT: 1.0000 "application " | TOPICAL_OINTMENT | CUTANEOUS | Status: DC | PRN

## 2013-08-25 MED ORDER — FENTANYL CITRATE 0.05 MG/ML IJ SOLN: 25.0000 ug | INTRAMUSCULAR | Status: DC | PRN

## 2013-08-25 MED ORDER — DIBUCAINE 1 % RE OINT: 1.0000 "application " | TOPICAL_OINTMENT | RECTAL | Status: DC | PRN

## 2013-08-25 MED ORDER — LACTATED RINGERS IV SOLN
INTRAVENOUS | Status: DC
  Administered 2013-08-25: 14:00:00 via INTRAVENOUS

## 2013-08-25 MED ORDER — DIPHENHYDRAMINE HCL 50 MG/ML IJ SOLN: 25.0000 mg | INTRAMUSCULAR | Status: DC | PRN

## 2013-08-25 MED ORDER — SIMETHICONE 80 MG PO CHEW
80.0000 mg | CHEWABLE_TABLET | ORAL | Status: DC
  Administered 2013-08-26 – 2013-08-27 (×2): 80 mg via ORAL
  Filled 2013-08-25 (×2): qty 1

## 2013-08-25 MED ORDER — MENTHOL 3 MG MT LOZG: 1.0000 | LOZENGE | OROMUCOSAL | Status: DC | PRN

## 2013-08-25 NOTE — Anesthesia Postprocedure Evaluation (Signed)
  Anesthesia Post-op Note  Patient: Soil scientist  Procedure(s) Performed: Procedure(s): CESAREAN SECTION (N/A)  Patient Location: Mother/Baby  Anesthesia Type:Epidural  Level of Consciousness: awake, alert , oriented and patient cooperative  Airway and Oxygen Therapy: Patient Spontanous Breathing  Post-op Pain: none  Post-op Assessment: Post-op Vital signs reviewed, Patient's Cardiovascular Status Stable, Respiratory Function Stable, Patent Airway, No signs of Nausea or vomiting, Adequate PO intake, Pain level controlled, No headache, No backache, No residual numbness and No residual motor weakness  Post-op Vital Signs: Reviewed and stable  Last Vitals:  Filed Vitals:   08/25/13 0550  BP: 106/68  Pulse: 98  Temp: 36.7 C  Resp: 20    Complications: No apparent anesthesia complications

## 2013-08-25 NOTE — Progress Notes (Signed)
UR completed 

## 2013-08-25 NOTE — Lactation Note (Signed)
This note was copied from the chart of Boy Tierney Gartley. Lactation Consultation Note  Initial visit at 19 hours of age.  Mom is finishing feeding on left breast and reports difficulty on right breast.  Repositioned to cross cradle hold.  Baby latches well with minimal assist.  Wide flanged lips and rhythmic suckling observed.  Encouraged pillow support.   [facility] LC resources given and discussed.  Encouraged to feed with early cues on demand.  Early newborn behavior discussed.  Hand expression demonstrated with colostrum visible.  Mom to call for assist as needed.    Patient Name: Boy Anelis Wavra [id number] Date: 08/25/2013 Reason for consult: Initial assessment   Maternal Data Has patient been taught Hand Expression?: Yes Does the patient have breastfeeding experience prior to this delivery?: No  Feeding Feeding Type: Breast Fed Length of feed:  (observed 10 minutes)  LATCH Score/Interventions Latch: Grasps breast easily, tongue down, lips flanged, rhythmical sucking. Intervention(s): Breast compression;Assist with latch  Audible Swallowing: A few with stimulation Intervention(s): Hand expression;Skin to skin Intervention(s): Skin to skin;Alternate breast massage;Hand expression  Type of Nipple: Everted at rest and after stimulation  Comfort (Breast/Nipple): Soft / non-tender     Hold (Positioning): Assistance needed to correctly position infant at breast and maintain latch. Intervention(s): Breastfeeding basics reviewed;Support Pillows;Position options;Skin to skin  LATCH Score: 8  Lactation Tools Discussed/Used     Consult Status Consult Status: Follow-up Date: 08/26/13 Follow-up type: In-patient    Arvella Merles [facility] 08/25/2013, 6:57 PM

## 2013-08-25 NOTE — Progress Notes (Signed)
Subjective: Postpartum Day 0: Cesarean Delivery right after midnight Patient reports incisional pain, + flatus and no problems voiding.    Objective: Vital signs in last 24 hours: Temp:  [98 F (36.7 C)-100.2 F (37.9 C)] 98 F (36.7 C) (05/22 0550) Pulse Rate:  [84-130] 98 (05/22 0550) Resp:  [16-29] 20 (05/22 0550) BP: (81-166)/(31-144) 106/68 mmHg (05/22 0550) SpO2:  [94 %-100 %] 94 % (05/22 0550) Weight:  [69.4 kg (153 lb)] 69.4 kg (153 lb) (05/21 6314)  Physical Exam:  General: alert, cooperative and no distress Lochia: appropriate Uterine Fundus: firm Incision: healing well, no significant drainage, no significant erythema DVT Evaluation: No evidence of DVT seen on physical exam. Negative Homan's sign. No cords or calf tenderness. No significant calf/ankle edema.   Recent Labs  08/24/13 0730 08/25/13 0605  HGB 12.9 11.0*  HCT 38.2 33.2*    Assessment/Plan: Status post Cesarean section. Doing well postoperatively.  Continue current care.  Lawernce Pitts 08/25/2013, 8:03 AM  I have seen and examined this patient and agree the above assessment. Jacklyn Shell 08/25/2013 9:02 PM

## 2013-08-25 NOTE — Anesthesia Postprocedure Evaluation (Signed)
  Anesthesia Post-op Note  Patient: Soil scientist  Procedure(s) Performed: Procedure(s): CESAREAN SECTION (N/A)  Patient Location: PACU  Anesthesia Type:Epidural  Level of Consciousness: awake, alert  and oriented  Airway and Oxygen Therapy: Patient Spontanous Breathing  Post-op Pain: none  Post-op Assessment: Post-op Vital signs reviewed, Patient's Cardiovascular Status Stable, Respiratory Function Stable, Patent Airway, No signs of Nausea or vomiting, Pain level controlled, No headache and No backache  Post-op Vital Signs: Reviewed and stable  Last Vitals:  Filed Vitals:   08/25/13 0045  BP:   Pulse: 108  Temp:   Resp: 21    Complications: No apparent anesthesia complications

## 2013-08-25 NOTE — Addendum Note (Signed)
Addendum created 08/25/13 0813 by Suella Grove, CRNA   Modules edited: Charges VN, Notes Section   Notes Section:  File: [id number]

## 2013-08-25 NOTE — Transfer of Care (Signed)
Immediate Anesthesia Transfer of Care Note  Patient: Emily Mack  Procedure(s) Performed: Procedure(s): CESAREAN SECTION (N/A)  Patient Location: PACU  Anesthesia Type:Epidural  Level of Consciousness: awake, alert , oriented and patient cooperative  Airway & Oxygen Therapy: Patient Spontanous Breathing  Post-op Assessment: Report given to PACU RN and Post -op Vital signs reviewed and stable  Post vital signs: Reviewed and stable  Complications: No apparent anesthesia complications

## 2013-08-25 NOTE — Op Note (Addendum)
08/24/2013 - 08/25/2013  12:10 AM  PATIENT:  Emily Mack  23 y.o. female  PRE-OPERATIVE DIAGNOSIS:  chorioamniotitis, non reassuring fetal heart rate, arrest of descent/dilation  POST-OPERATIVE DIAGNOSIS:  same  PROCEDURE:  Procedure(s): CESAREAN SECTION (N/A), PRIMARY LOW TRANSVERSE  SURGEON:  Surgeon(s) and Role:    * Allie Bossier, MD - Primary  FINDINGS: Living female infant with Apgars of 9&9, normal pelvic anatomy, intact placenta with 3 vessel cord  PHYSICIAN ASSISTANT:   ASSISTANTS: none   ANESTHESIA:   epidural  EBL:  Total I/O In: 700 [I.V.:700] Out: 500 [Urine:500]  BLOOD ADMINISTERED:none  DRAINS: none   LOCAL MEDICATIONS USED:  MARCAINE     SPECIMEN:  Source of Specimen:  cord blood  DISPOSITION OF SPECIMEN:  PATHOLOGY  COUNTS:  YES  TOURNIQUET:  * No tourniquets in log *  DICTATION: .Dragon Dictation  PLAN OF CARE: Admit to inpatient   PATIENT DISPOSITION:  PACU - hemodynamically stable.   Delay start of Pharmacological VTE agent (>24hrs) due to surgical blood loss or risk of bleeding: not applicable  The risks, benefits, and alternatives of surgery were explained, understood, accepted. Consents were signed. All questions were answered. Her epidural was bolused for surgery. In the OR her abdomen and vagina were prepped and draped in the usual sterile fashion. A Foley catheter had been placed, draining clear urine throughout case. Timeout procedure was done. After adequate anesthesia was assured, 30 mL of 0.5% Marcaine was injected into the subcutaneous tissue about 2 cm above the symphysis pubis. An incision was made there. The incision was carried down through the subcutaneous tissue to the fascia. The fascia was scored the midline and extended bilaterally. The middle 20% of the rectus muscles were separated in a transverse fashion using electrosurgical technique. Excellent hemostasis was maintained. The peritoneum was entered with hemostats. Peritoneal  incision was extended bilaterally with the Bovie. The bladder blade was placed. A transverse incision was made on the well-developed lower uterine segment. The uterine incision was extended with traction on each side.  Clear fluid was noted with amniotomy. The baby was delivered from a vertex lie with a OA presentation. The mouth and nostrils were suctioned prior to delivery of the shoulders.  The baby's cord was clamped and cut, and he was transferred to the NICU personnel for routine care. The placenta was delivered intact with traction. The uterus was left in situ and the interior was cleaned with a dry lap sponge. The uterine incision was closed with 2-0 Vicryl running locking suture in one layer due to the thinness of the lower uterine segment.  Excellent hemostasis was noted. By tilting the uterus each side was able to visualize the adnexa, and they were normal. The rectus fascia rectus muscles were noted be hemostatic as well. The fascia was closed with a 0 Vicryl suture in a running nonlocking fashion. No defects were palpable. The subcutaneous tissue was irrigated, clean, and dried. A subcuticular closure was done with a 3-0 Vicryl suture. Steri-Strips are placed. Excellent cosmetic results were obtained. She was taken to the recovery room in stable condition. She tolerated the procedure well.

## 2013-08-25 NOTE — Progress Notes (Addendum)
ANTIBIOTIC CONSULT NOTE - INITIAL  Pharmacy Consult for Gentamicin Indication: Chorioamnionitis  No Known Allergies  Patient Measurements: Height: 4\' 10"  (147.3 cm) Weight: 153 lb (69.4 kg) IBW/kg (Calculated) : 40.9 Adjusted Body Weight:49.5 kg  Vital Signs: Temp: 99.2 F (37.3 C) (05/21 2322) Temp src: Oral (05/21 2322) BP: 127/72 mmHg (05/21 2328) Pulse Rate: 127 (05/21 2328) Intake/Output from previous day: 05/21 0701 - 05/22 0700 In: 2000 [I.V.:2000] Out: 1495 [Urine:1450; Blood:45] Intake/Output from this shift: Total I/O In: 2000 [I.V.:2000] Out: 695 [Urine:650; Blood:45]  Labs:  Recent Labs  08/24/13 0730  WBC 16.2*  HGB 12.9  PLT 244   CrCl is unknown because no creatinine reading has been taken. No results found for this basename: VANCOTROUGH, VANCOPEAK, VANCORANDOM, GENTTROUGH, GENTPEAK, GENTRANDOM, TOBRATROUGH, TOBRAPEAK, TOBRARND, AMIKACINPEAK, AMIKACINTROU, AMIKACIN,  in the last 72 hours   Microbiology: No results found for this or any previous visit (from the past 720 hour(s)).  Medical History: Past Medical History  Diagnosis Date  . Medical history non-contributory     Medications:  Ampicillin 2 Gm IV x one dose at 1745 08/24/13 Cefazolin 2 Gm IV x one dose pre-op at 2347 08/24/13 Gentamicin 140 mg IV x one dose at 1819 08/24/13  Assessment: 23 yo G1P1 s/p C-Section for arrest of descent/dilation and non-reassuring FHR, who is to receive 3 post-op doses of gentamicin with clindamycin for chorioamnionitis.  Goal of Therapy:  Gentamicin peaks 6-8 mcg/ml and troughs <1 mcg/ml  Plan:  Gentamicin 130 mg IV loading dose; then 120 mg IV every 8 hours for 2 doses. We will check a serum creatinine if gentamicin is continued after 3 total doses. Serum gentamicin levels as indicated if gentamicin is continued.  Arelia Sneddon 08/25/2013,12:31 AM

## 2013-08-26 NOTE — Progress Notes (Signed)
Subjective: Postpartum Day 1: Cesarean Delivery Patient reports tolerating PO, + flatus and no problems voiding.    Objective: Vital signs in last 24 hours: Temp:  [97.9 F (36.6 C)-98.8 F (37.1 C)] 98 F (36.7 C) (05/23 0544) Pulse Rate:  [74-137] 74 (05/23 0544) Resp:  [18-20] 18 (05/23 0544) BP: (106-132)/(70-84) 126/84 mmHg (05/23 0544) SpO2:  [95 %-97 %] 97 % (05/22 2100)  Physical Exam:  General: alert and no distress Lochia: appropriate Uterine Fundus: firm Incision: healing well, no significant drainage DVT Evaluation: No evidence of DVT seen on physical exam.   Recent Labs  08/24/13 0730 08/25/13 0605  HGB 12.9 11.0*  HCT 38.2 33.2*    Assessment/Plan: Status post Cesarean section. Doing well postoperatively.  Continue current care.  Tawnya Crook 08/26/2013, 7:13 AM

## 2013-08-26 NOTE — Lactation Note (Signed)
This note was copied from the chart of Emily Nyomi Cai. Lactation Consultation Note    Follow up consult with this mom and baby, now 37 hours post patrum. Baby has voided twic and stooled multiple times. Mom has brest fed frequently, and denies any questions/cocnerns. Breast care/engorgedmnt reviewed.  Patient Name: Emily Mack Date: 08/26/2013 Reason for consult: Follow-up assessment   Maternal Data    Feeding Feeding Type: Breast Fed Length of feed: 30 min  LATCH Score/Interventions                      Lactation Tools Discussed/Used     Consult Status Consult Status: Complete Follow-up type: Call as needed    Alfred Levins 08/26/2013, 1:44 PM

## 2013-08-27 MED ORDER — MEASLES, MUMPS & RUBELLA VAC ~~LOC~~ INJ
0.5000 mL | INJECTION | Freq: Once | SUBCUTANEOUS | Status: AC
  Administered 2013-08-27: 0.5 mL via SUBCUTANEOUS
  Filled 2013-08-27: qty 0.5

## 2013-08-27 MED ORDER — OXYCODONE-ACETAMINOPHEN 5-325 MG PO TABS: 1.0000 | ORAL_TABLET | ORAL | Status: DC | PRN

## 2013-08-27 NOTE — Discharge Summary (Signed)
Obstetric Discharge Summary Reason for Admission: onset of labor and augmentation Prenatal Procedures: NST Intrapartum Procedures: cesarean: low cervical, transverse for FTP Postpartum Procedures: none Complications-Operative and Postpartum: none Hemoglobin  Date Value Ref Range Status  08/25/2013 11.0* 12.0 - 15.0 g/dL Final     HCT  Date Value Ref Range Status  08/25/2013 33.2* 36.0 - 46.0 % Final    Physical Exam:  General: alert, cooperative and no distress Lochia: appropriate Uterine Fundus: firm Incision: healing well, no significant drainage, no significant erythema, honeycomb dressing intact.  DVT Evaluation: No evidence of DVT seen on physical exam.  Discharge Diagnoses: Term Pregnancy-delivered  Discharge Information: Date: 08/27/2013 Activity: unrestricted Diet: routine Medications: PNV and Ibuprofen Condition: stable Instructions: refer to practice specific booklet Discharge to: home   Follow-up Information   [facility] up with [facility] In 5 weeks.   Specialty:  Obstetrics and Gynecology   Contact information:   [address] [place] [address] [phone]      Newborn Data: Live born female  Birth Weight: 6 lb 10.9 oz (3030 g) APGAR: 9, 9  Home with mother.  Pt presented in early labor and was admitted and then augmented. After several hours, she failed to progress past 8cm and thus a decision was made to proceed with cesarean delivery.  No complications with a LTCS and her postpartum care was uncomplicated. She declines contraception and is breast feeding.     Emily Mack 08/27/2013, 8:57 AM

## 2013-08-27 NOTE — Discharge Instructions (Signed)
Cesarean Delivery °Care After °Refer to this sheet in the next few weeks. These instructions provide you with information on caring for yourself after your procedure. Your health care provider may also give you specific instructions. Your treatment has been planned according to current medical practices, but problems sometimes occur. Call your health care provider if you have any problems or questions after you go home. °HOME CARE INSTRUCTIONS  °· Only take over-the-counter or prescription medications as directed by your health care provider. °· Do not drink alcohol, especially if you are breastfeeding or taking medication to relieve pain. °· Do not chew or smoke tobacco. °· Continue to use good perineal care. Good perineal care includes: °· Wiping your perineum from front to back. °· Keeping your perineum clean. °· Check your surgical cut (incision) daily for increased redness, drainage, swelling, or separation of skin. °· Clean your incision gently with soap and water every day, and then pat it dry. If your health care provider says it is OK, leave the incision uncovered. Use a bandage (dressing) if the incision is draining fluid or appears irritated. If the adhesive strips across the incision do not fall off within 7 days, carefully peel them off. °· Hug a pillow when coughing or sneezing until your incision is healed. This helps to relieve pain. °· Do not use tampons or douche until your health care provider says it is okay. °· Shower, wash your hair, and take tub baths as directed by your health care provider. °· Wear a well-fitting bra that provides breast support. °· Limit wearing support panties or control-top hose. °· Drink enough fluids to keep your urine clear or pale yellow. °· Eat high-fiber foods such as whole grain cereals and breads, brown rice, beans, and fresh fruits and vegetables every day. These foods may help prevent or relieve constipation. °· Resume activities such as climbing stairs,  driving, lifting, exercising, or traveling as directed by your health care provider. °· Talk to your health care provider about resuming sexual activities. This is dependent upon your risk of infection, your rate of healing, and your comfort and desire to resume sexual activity. °· Try to have someone help you with your household activities and your newborn for at least a few days after you leave the hospital. °· Rest as much as possible. Try to rest or take a nap when your newborn is sleeping. °· Increase your activities gradually. °· Keep all of your scheduled postpartum appointments. It is very important to keep your scheduled follow-up appointments. At these appointments, your health care provider will be checking to make sure that you are healing physically and emotionally. °SEEK MEDICAL CARE IF:  °· You are passing large clots from your vagina. Save any clots to show your health care provider. °· You have a foul smelling discharge from your vagina. °· You have trouble urinating. °· You are urinating frequently. °· You have pain when you urinate. °· You have a change in your bowel movements. °· You have increasing redness, pain, or swelling near your incision. °· You have pus draining from your incision. °· Your incision is separating. °· You have painful, hard, or reddened breasts. °· You have a severe headache. °· You have blurred vision or see spots. °· You feel sad or depressed. °· You have thoughts of hurting yourself or your newborn. °· You have questions about your care, the care of your newborn, or medications. °· You are dizzy or lightheaded. °· You have a rash. °· You   have pain, redness, or swelling at the site of the removed intravenous access (IV) tube. °· You have nausea or vomiting. °· You stopped breastfeeding and have not had a menstrual period within 12 weeks of stopping. °· You are not breastfeeding and have not had a menstrual period within 12 weeks of delivery. °· You have a fever. °SEEK  IMMEDIATE MEDICAL CARE IF: °· You have persistent pain. °· You have chest pain. °· You have shortness of breath. °· You faint. °· You have leg pain. °· You have stomach pain. °· Your vaginal bleeding saturates 2 or more sanitary pads in 1 hour. °MAKE SURE YOU:  °· Understand these instructions. °· Will watch your condition. °· Will get help right away if you are not doing well or get worse. °Document Released: 12/13/2001 Document Revised: 11/23/2012 Document Reviewed: 11/18/2011 °[employer]® Patient Information ©2014 [employer]. ° ° ° °

## 2013-08-27 NOTE — Lactation Note (Signed)
This note was copied from the chart of Emily Essence Dearing. Lactation Consultation Note    Follow up consult - mom denies any questions/concerns. Doing well with breast feeding.  Patient Name: Emily Mack [id number] Date: 08/27/2013 Reason for consult: Follow-up assessment   Maternal Data    Feeding Feeding Type: Breast Fed Length of feed: 20 min  LATCH Score/Interventions Latch: Grasps breast easily, tongue down, lips flanged, rhythmical sucking.  Audible Swallowing: Spontaneous and intermittent Intervention(s): Skin to skin Intervention(s): Skin to skin  Type of Nipple: Everted at rest and after stimulation  Comfort (Breast/Nipple): Soft / non-tender     Hold (Positioning): Assistance needed to correctly position infant at breast and maintain latch.  LATCH Score: 9  Lactation Tools Discussed/Used     Consult Status Consult Status: Complete Follow-up type: Call as needed    Alfred Levins 08/27/2013, 10:05 AM

## 2013-08-28 ENCOUNTER — Encounter (HOSPITAL_COMMUNITY): Payer: Self-pay | Admitting: Obstetrics & Gynecology

## 2013-08-28 NOTE — Discharge Summary (Signed)
Attestation of Attending Supervision of Obstetric Fellow: Evaluation and management procedures were performed by the Obstetric Fellow under my supervision and collaboration.  I have reviewed the Obstetric Fellow's note and chart, and I agree with the management and plan.  Frisco Cordts, MD, FACOG Attending Obstetrician & Gynecologist Faculty Practice, [facility]   

## 2013-10-10 ENCOUNTER — Encounter: Payer: Self-pay | Admitting: Family Medicine

## 2013-10-10 ENCOUNTER — Ambulatory Visit (INDEPENDENT_AMBULATORY_CARE_PROVIDER_SITE_OTHER): Payer: Medicaid Other | Admitting: Family Medicine

## 2013-10-10 NOTE — Progress Notes (Signed)
  Subjective:     Emily Mack is a 23 y.o. female who presents for a postpartum visit. She is 6 weeks postpartum following a low cervical transverse Cesarean section. I have fully reviewed the prenatal and intrapartum course. The delivery was at 40 gestational weeks. Outcome: primary cesarean section, low transverse incision. Anesthesia: epidural. Postpartum course has been unremarkable. Baby's course has been ok. Baby is feeding by breast. Bleeding no bleeding. Bowel function is normal. Bladder function is normal. Patient is sexually active. Contraception method is condoms. Postpartum depression screening: negative.  The following portions of the patient's history were reviewed and updated as appropriate: allergies, current medications, past family history, past medical history, past social history, past surgical history and problem list.  Review of Systems Pertinent items are noted in HPI.   Objective:    BP 115/90  Pulse 88  Ht 4\' 10"  (1.473 m)  Wt 130 lb (58.968 kg)  BMI 27.18 kg/m2  Breastfeeding? Yes  General:  alert, cooperative and appears stated age   Vulva:  normal  Vagina: normal vagina  Cervix:  no cervical motion tenderness  Corpus: normal size, contour, position, consistency, mobility, non-tender  Adnexa:  normal adnexa        Assessment:     Normal postpartum exam. Pap smear not done at today's visit.   Plan:    1. Contraception: condoms 2. Pap was WNL in 10/14 3. Follow up in: 1 year or as needed.

## 2013-10-10 NOTE — Patient Instructions (Signed)
Contraception Choices Contraception (birth control) is the use of any methods or devices to prevent pregnancy. Below are some methods to help avoid pregnancy. HORMONAL METHODS   Contraceptive implant. This is a thin, plastic tube containing progesterone hormone. It does not contain estrogen hormone. Your health care provider inserts the tube in the inner part of the upper arm. The tube can remain in place for up to 3 years. After 3 years, the implant must be removed. The implant prevents the ovaries from releasing an egg (ovulation), thickens the cervical mucus to prevent sperm from entering the uterus, and thins the lining of the inside of the uterus.  Progesterone-only injections. These injections are given every 3 months by your health care provider to prevent pregnancy. This synthetic progesterone hormone stops the ovaries from releasing eggs. It also thickens cervical mucus and changes the uterine lining. This makes it harder for sperm to survive in the uterus.  Birth control pills. These pills contain estrogen and progesterone hormone. They work by preventing the ovaries from releasing eggs (ovulation). They also cause the cervical mucus to thicken, preventing the sperm from entering the uterus. Birth control pills are prescribed by a health care provider.Birth control pills can also be used to treat heavy periods.  Minipill. This type of birth control pill contains only the progesterone hormone. They are taken every day of each month and must be prescribed by your health care provider.  Birth control patch. The patch contains hormones similar to those in birth control pills. It must be changed once a week and is prescribed by a health care provider.  Vaginal ring. The ring contains hormones similar to those in birth control pills. It is left in the vagina for 3 weeks, removed for 1 week, and then a new one is put back in place. The patient must be comfortable inserting and removing the ring  from the vagina.A health care provider's prescription is necessary.  Emergency contraception. Emergency contraceptives prevent pregnancy after unprotected sexual intercourse. This pill can be taken right after sex or up to 5 days after unprotected sex. It is most effective the sooner you take the pills after having sexual intercourse. Most emergency contraceptive pills are available without a prescription. Check with your pharmacist. Do not use emergency contraception as your only form of birth control. BARRIER METHODS   Female condom. This is a thin sheath (latex or rubber) that is worn over the penis during sexual intercourse. It can be used with spermicide to increase effectiveness.  Female condom. This is a soft, loose-fitting sheath that is put into the vagina before sexual intercourse.  Diaphragm. This is a soft, latex, dome-shaped barrier that must be fitted by a health care provider. It is inserted into the vagina, along with a spermicidal jelly. It is inserted before intercourse. The diaphragm should be left in the vagina for 6 to 8 hours after intercourse.  Cervical cap. This is a round, soft, latex or plastic cup that fits over the cervix and must be fitted by a health care provider. The cap can be left in place for up to 48 hours after intercourse.  Sponge. This is a soft, circular piece of polyurethane foam. The sponge has spermicide in it. It is inserted into the vagina after wetting it and before sexual intercourse.  Spermicides. These are chemicals that kill or block sperm from entering the cervix and uterus. They come in the form of creams, jellies, suppositories, foam, or tablets. They do not require a   prescription. They are inserted into the vagina with an applicator before having sexual intercourse. The process must be repeated every time you have sexual intercourse. INTRAUTERINE CONTRACEPTION  Intrauterine device (IUD). This is a T-shaped device that is put in a woman's uterus  during a menstrual period to prevent pregnancy. There are 2 types:  Copper IUD. This type of IUD is wrapped in copper wire and is placed inside the uterus. Copper makes the uterus and fallopian tubes produce a fluid that kills sperm. It can stay in place for 10 years.  Hormone IUD. This type of IUD contains the hormone progestin (synthetic progesterone). The hormone thickens the cervical mucus and prevents sperm from entering the uterus, and it also thins the uterine lining to prevent implantation of a fertilized egg. The hormone can weaken or kill the sperm that get into the uterus. It can stay in place for 3-5 years, depending on which type of IUD is used. PERMANENT METHODS OF CONTRACEPTION  Female tubal ligation. This is when the woman's fallopian tubes are surgically sealed, tied, or blocked to prevent the egg from traveling to the uterus.  Hysteroscopic sterilization. This involves placing a small coil or insert into each fallopian tube. Your doctor uses a technique called hysteroscopy to do the procedure. The device causes scar tissue to form. This results in permanent blockage of the fallopian tubes, so the sperm cannot fertilize the egg. It takes about 3 months after the procedure for the tubes to become blocked. You must use another form of birth control for these 3 months.  Female sterilization. This is when the female has the tubes that carry sperm tied off (vasectomy).This blocks sperm from entering the vagina during sexual intercourse. After the procedure, the man can still ejaculate fluid (semen). NATURAL PLANNING METHODS  Natural family planning. This is not having sexual intercourse or using a barrier method (condom, diaphragm, cervical cap) on days the woman could become pregnant.  Calendar method. This is keeping track of the length of each menstrual cycle and identifying when you are fertile.  Ovulation method. This is avoiding sexual intercourse during ovulation.  Symptothermal  method. This is avoiding sexual intercourse during ovulation, using a thermometer and ovulation symptoms.  Post-ovulation method. This is timing sexual intercourse after you have ovulated. Regardless of which type or method of contraception you choose, it is important that you use condoms to protect against the transmission of sexually transmitted infections (STIs). Talk with your health care provider about which form of contraception is most appropriate for you. Document Released: 03/23/2005 Document Revised: 03/28/2013 Document Reviewed: 09/15/2012 [employer] Patient Information 2015 [employer]. This information is not intended to replace advice given to you by your health care provider. Make sure you discuss any questions you have with your health care provider.  

## 2013-10-12 ENCOUNTER — Ambulatory Visit: Payer: Medicaid Other | Admitting: Obstetrics & Gynecology

## 2014-01-31 IMAGING — US US OB COMP +14 WK
1 series · 12 of 28 positions shown · non-contrast
Comparison: none

[Series 1: us ob comp +14 wk · 0.21mm/px · 12 of 71 slices shown]
[im 3/71]
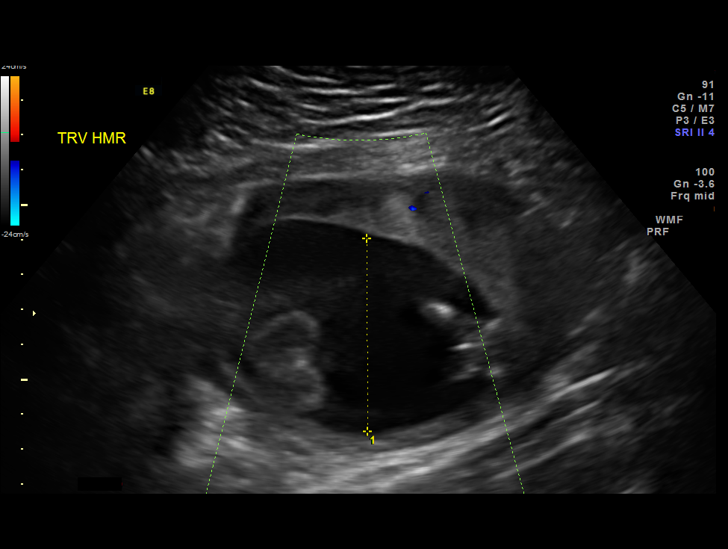
[im 8/71]
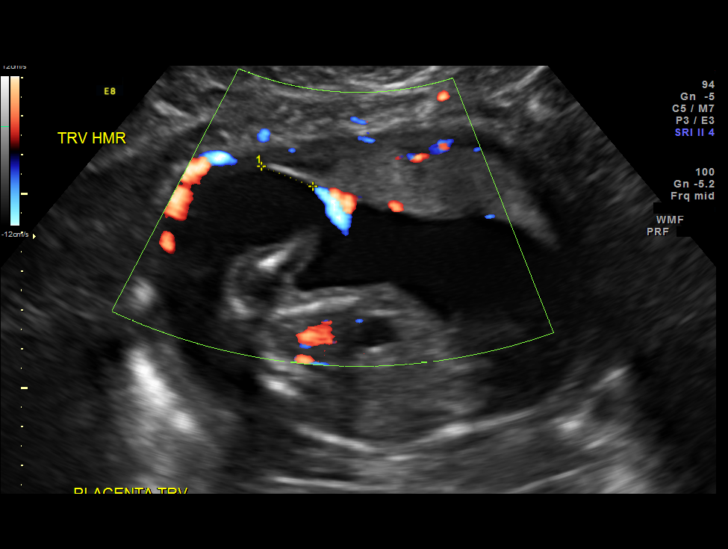
[im 13/71]
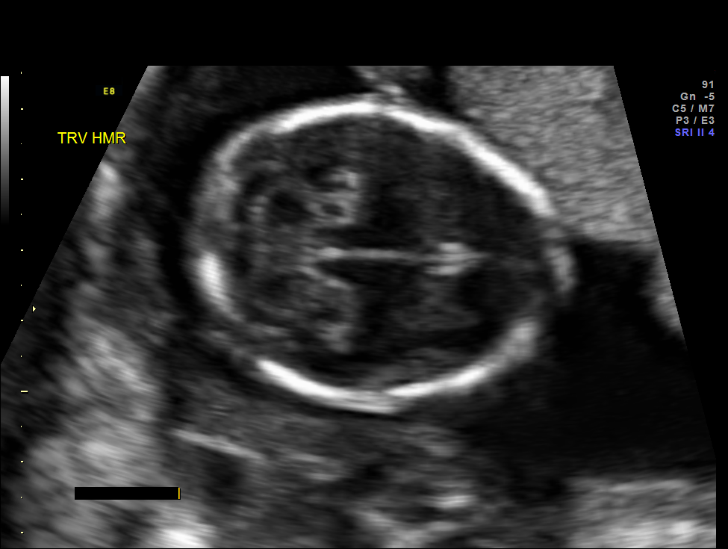
[im 21/71]
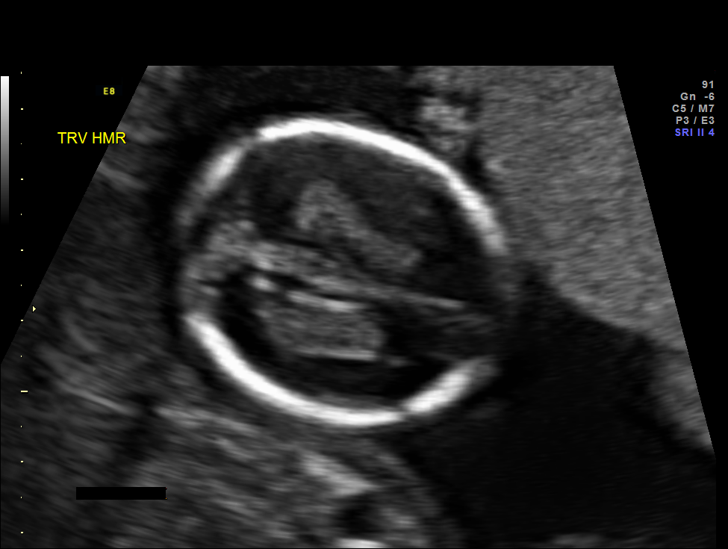
[im 26/71]
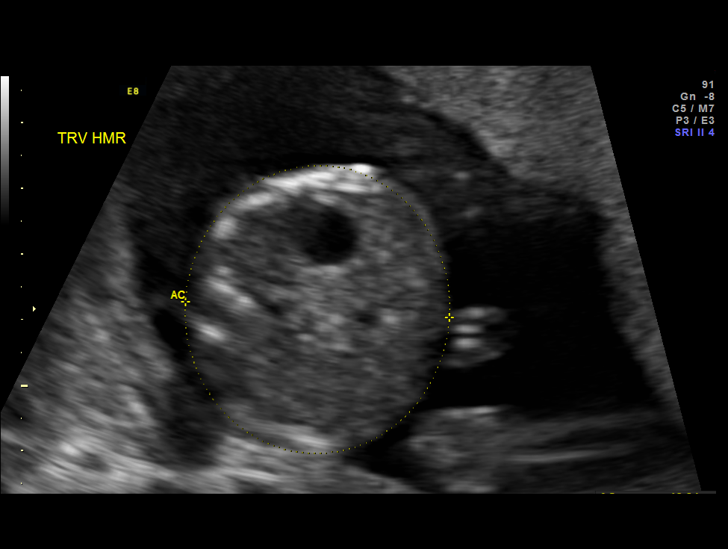
[im 32/71]
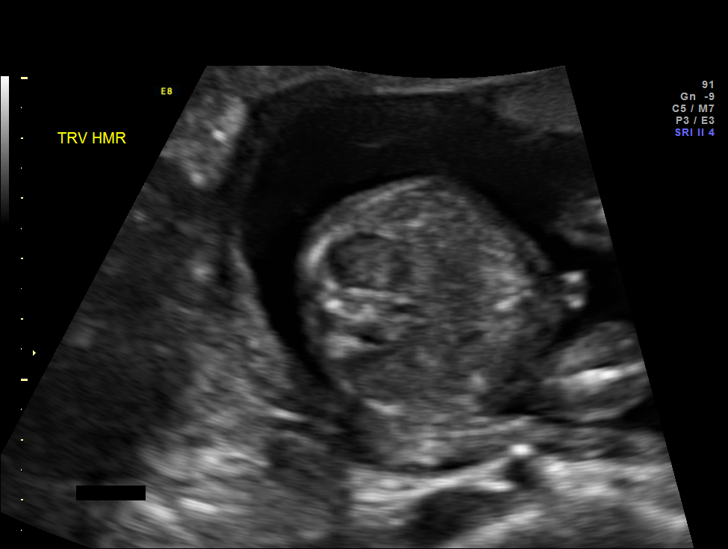
[im 39/71]
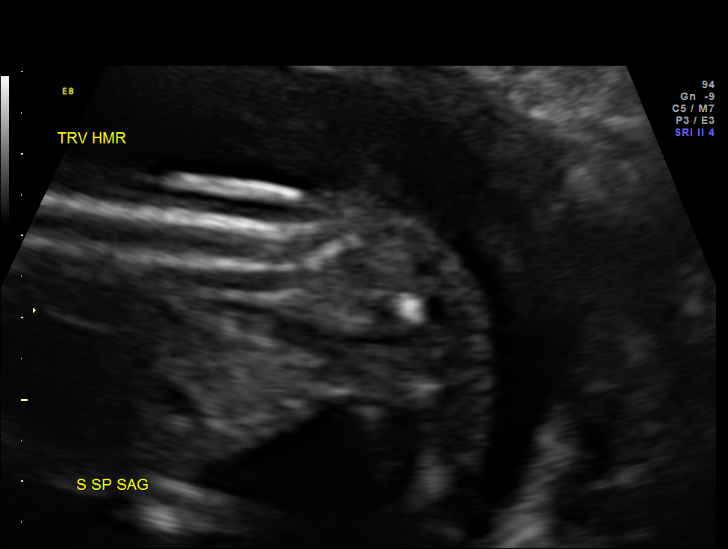
[im 45/71]
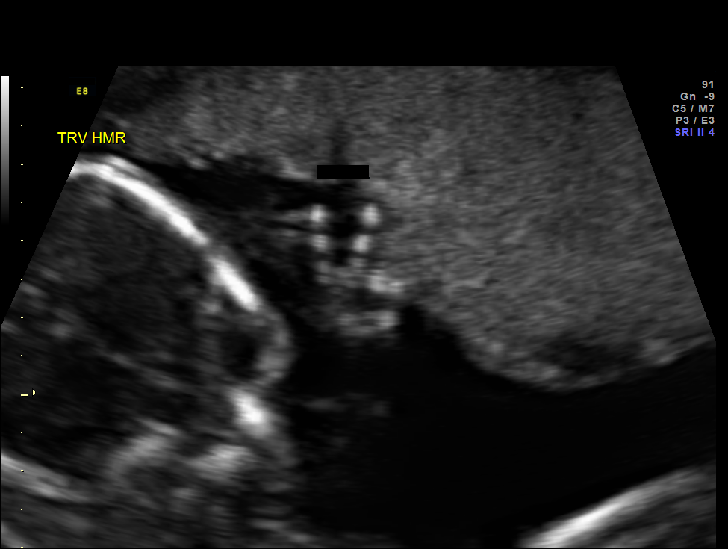
[im 50/71]
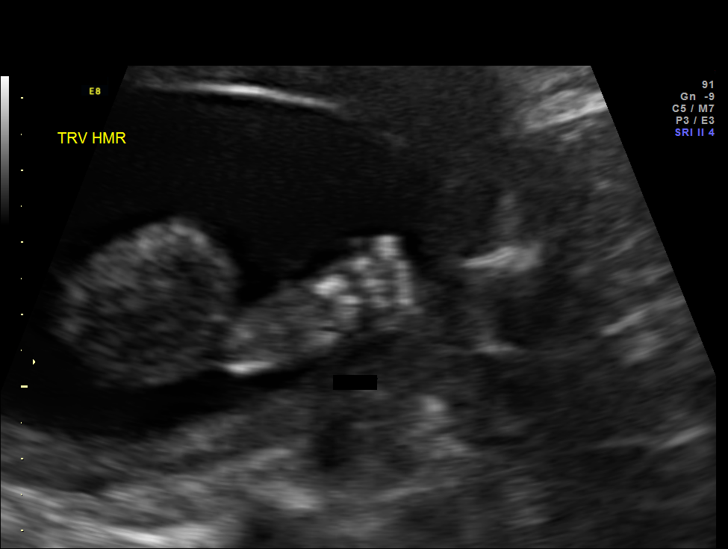
[im 58/71]
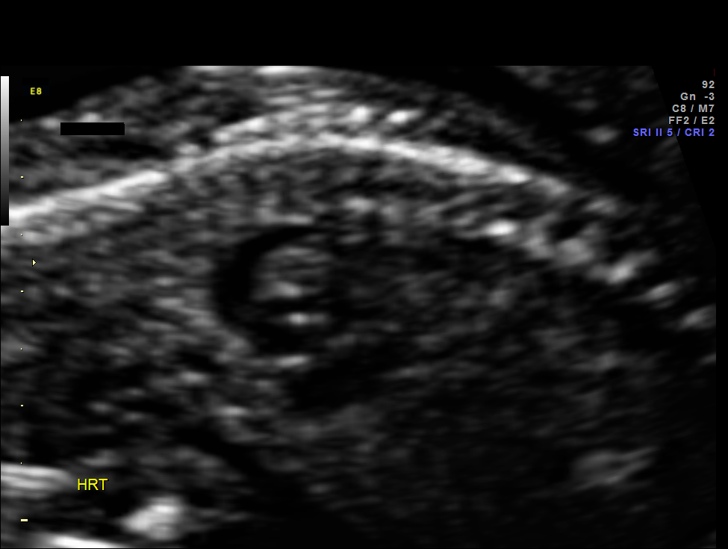
[im 63/71]
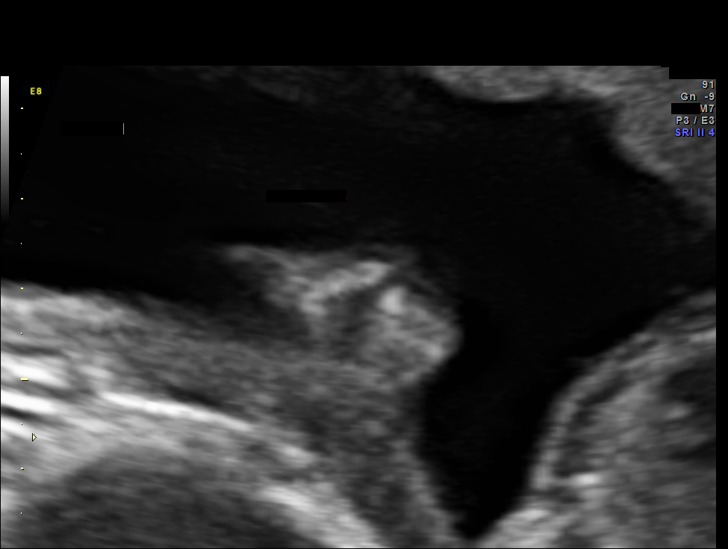
[im 68/71]
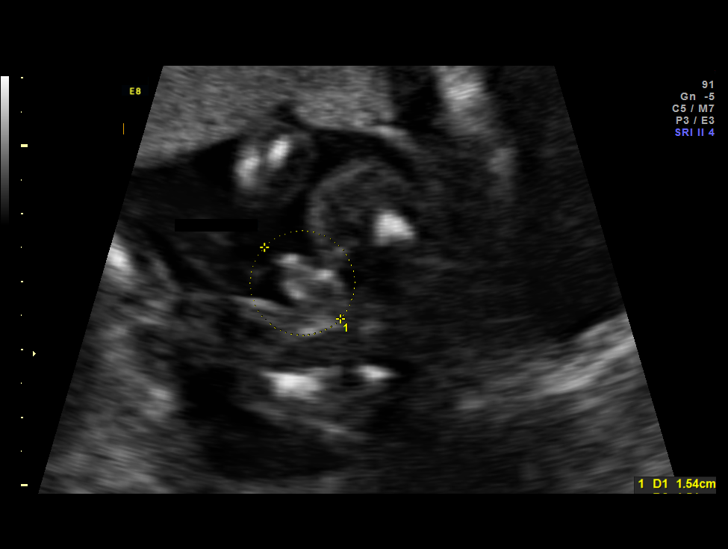

[12 of 28 positions shown; findings below may reference images not displayed]

OBSTETRICS REPORT
                      (Signed Final 03/22/2013 [DATE])

Service(s) Provided

 US OB COMP + 14 WK                                    76805.1
Indications

 Basic anatomic survey
Fetal Evaluation

 Num Of Fetuses:    1
 Fetal Heart Rate:  147                          bpm
 Cardiac Activity:  Observed
 Presentation:      Variable
 Placenta:          Anterior, above cervical os
 P. Cord            Marginal insertion
 Insertion:

 Amniotic Fluid
 AFI FV:      Subjectively within normal limits
                                             Larg Pckt:     5.5  cm
Biometry

 BPD:     40.2  mm     G. Age:  18w 1d                CI:         79.0   70 - 86
 OFD:     50.9  mm                                    FL/HC:      17.0   15.8 -
                                                                         18
 HC:     146.2  mm     G. Age:  17w 6d       20  %    HC/AC:      1.10   1.07 -

 AC:     132.4  mm     G. Age:  18w 5d       62  %    FL/BPD:
 FL:      24.8  mm     G. Age:  17w 4d       19  %    FL/AC:      18.7   20 - 24
 HUM:       24  mm     G. Age:  17w 3d       29  %
 CER:     18.6  mm     G. Age:  18w 2d       50  %
 NFT:      2.9  mm

 Est. FW:     225  gm      0 lb 8 oz     44  %
Gestational Age

 LMP:           18w 2d        Date:  11/14/12                 EDD:   08/21/13
 U/S Today:     18w 0d                                        EDD:   08/23/13
 Best:          18w 2d     Det. By:  LMP  (11/14/12)          EDD:   08/21/13
Anatomy
 Cranium:          Appears normal         Aortic Arch:      Appears normal
 Fetal Cavum:      Appears normal         Ductal Arch:      Appears normal
 Ventricles:       Appears normal         Diaphragm:        Appears normal
 Choroid Plexus:   Appears normal         Stomach:          Appears normal
 Cerebellum:       Appears normal         Abdomen:          Appears normal
 Posterior Fossa:  Appears normal         Abdominal Wall:   Appears nml (cord
                                                            insert, abd wall)
 Nuchal Fold:      Appears normal         Cord Vessels:     Appears normal (3
                                                            vessel cord)
 Face:             Appears normal         Kidneys:          Appear normal
                   (orbits and profile)
 Lips:             Appears normal         Bladder:          Appears normal
 Heart:            Appears normal         Spine:            Appears normal
                   (4CH, axis, and
                   situs)
 RVOT:             Appears normal         Lower             Appears normal
                                          Extremities:
 LVOT:             Appears normal         Upper             Appears normal
                                          Extremities:

 Other:  Parents do not wish to know sex of fetus. Heels and 5th digit
         visualized. Nasal bone visualized.
Targeted Anatomy

 Fetal Central Nervous System
 Cisterna Magna:
Cervix Uterus Adnexa

 Cervical Length:    3.3      cm

 Cervix:       Normal appearance by transabdominal scan.
 Left Ovary:    Not visualized.
 Right Ovary:   Within normal limits.

 Adnexa:     No abnormality visualized.
Impression

 IUP at 18+2 weeks
 Normal detailed fetal anatomy
 Markers of aneuploidy: none
 Normal amniotic fluid volume
 Measurements consistent with LMP dating

 Low risk first trimester screen
Recommendations

 Follow-up as clinically indicated

## 2014-02-05 ENCOUNTER — Encounter: Payer: Self-pay | Admitting: Family Medicine

## 2014-07-05 IMAGING — US US FETAL BPP W/O NONSTRESS
1 series · 6 of 6 positions shown · non-contrast
Comparison: none

[Series 1: us fetal bpp w/o nonstress · non-contrast · 6 acquisitions, 6 frames shown]
[im 1/6]
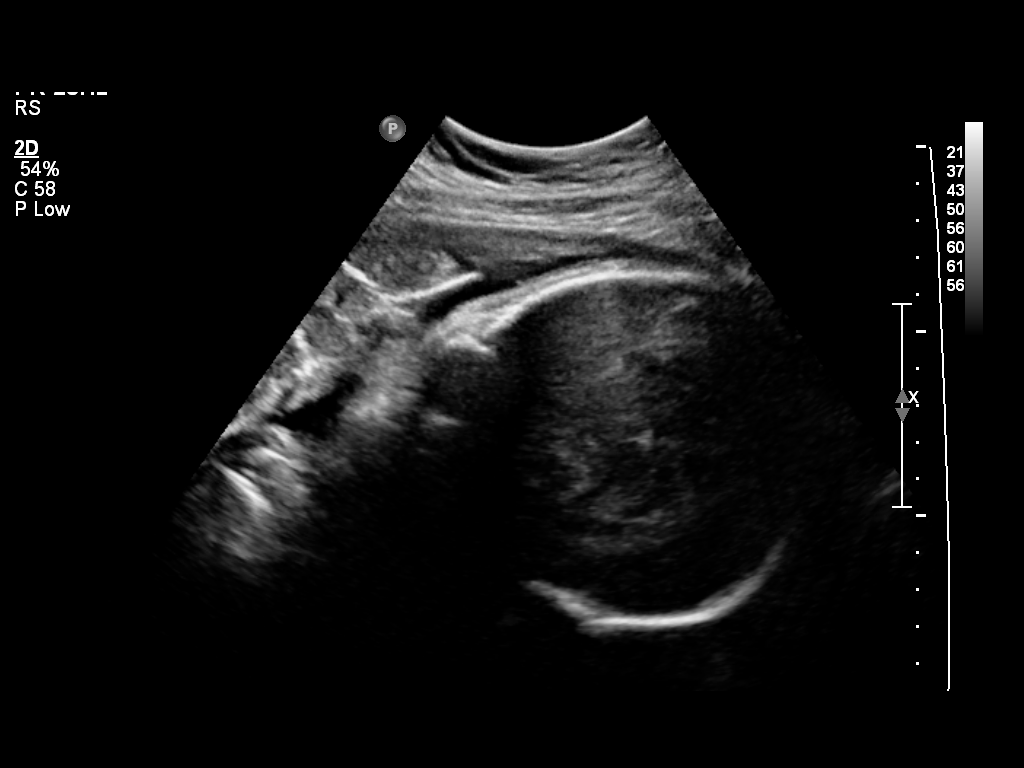
[im 2/6]
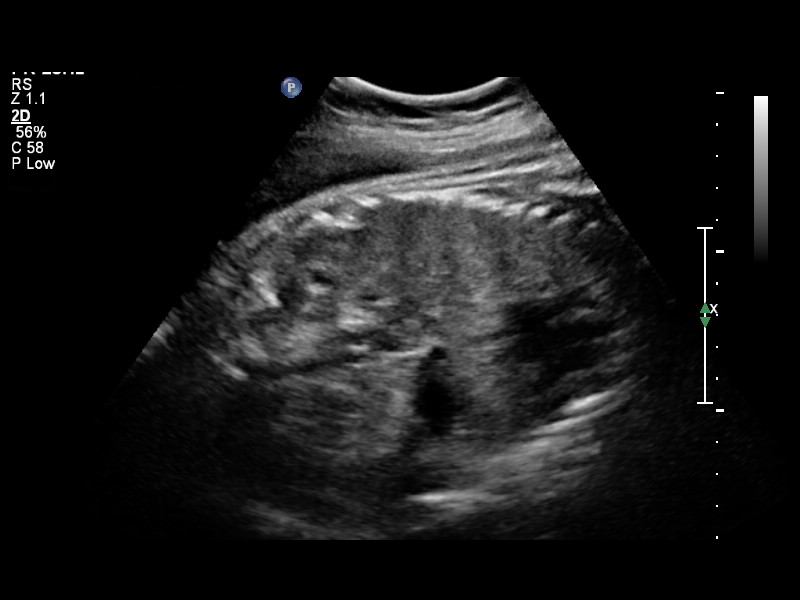
[im 3/6]
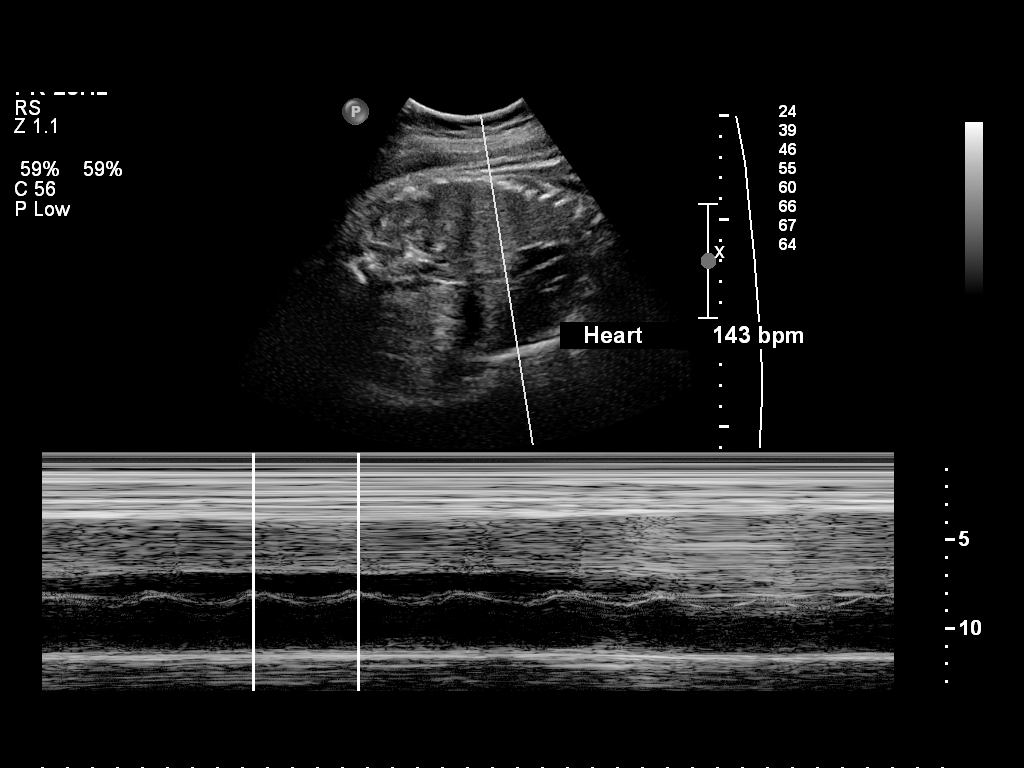
[im 4/6]
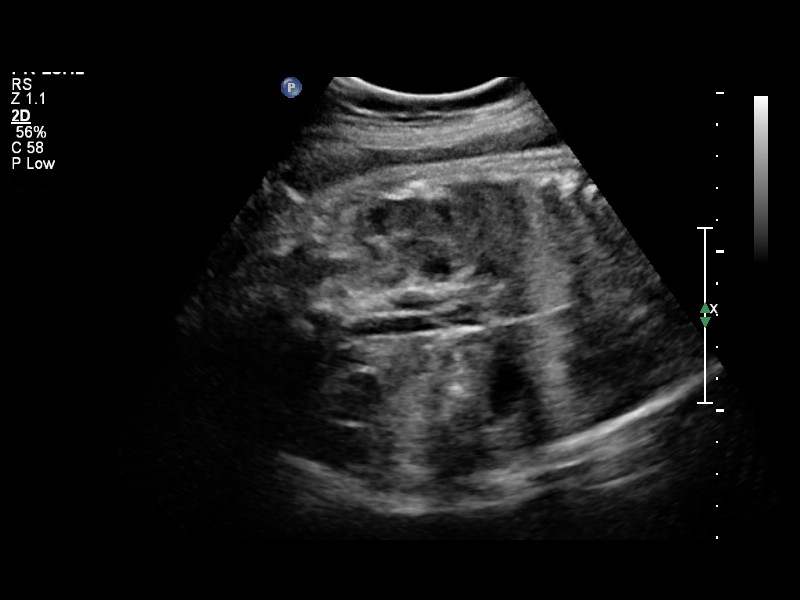
[im 5/6]
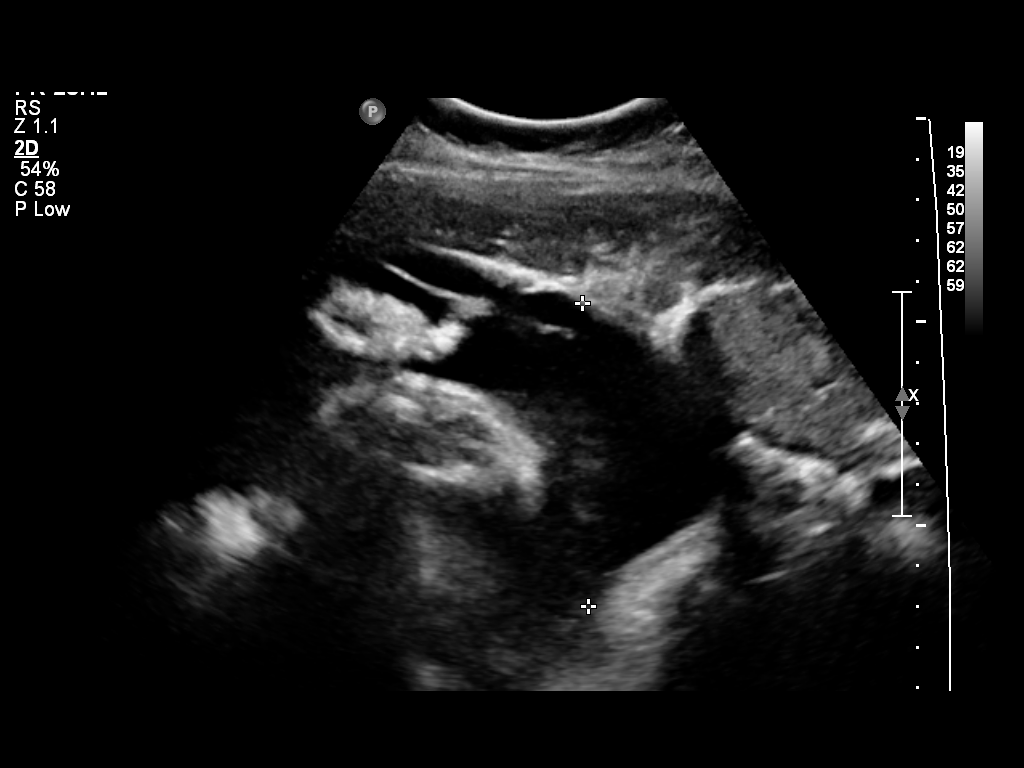
[im 6/6]
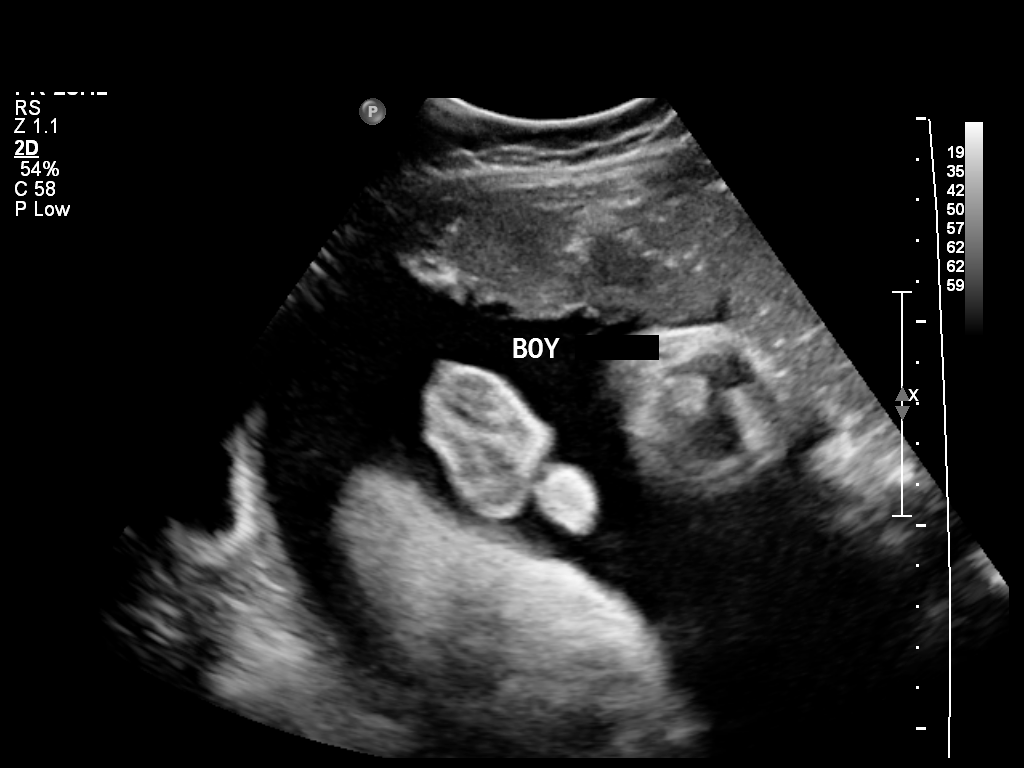

[6 of 6 positions shown; findings below may reference images not displayed]

OBSTETRICS REPORT
                      (Signed Final 08/24/2013 [DATE])

Service(s) Provided

Indications

 Non-reactive NST
 Postdate pregnancy (40-42 weeks)
Fetal Evaluation

 Num Of Fetuses:    1
 Fetal Heart Rate:  143                          bpm
 Cardiac Activity:  Observed
 Presentation:      Cephalic

 Comment:    [DATE] in 5 minutes

 Amniotic Fluid
 AFI FV:      Subjectively within normal limits
                                             Larg Pckt:     7.4  cm
Biophysical Evaluation

 Amniotic F.V:   Within normal limits       F. Tone:        Observed
 F. Movement:    Observed                   Score:          [DATE]
 F. Breathing:   Observed
Gestational Age

 LMP:           40w 3d        Date:  11/14/12                 EDD:   08/21/13
 Best:          40w 3d     Det. By:  LMP  (11/14/12)          EDD:   08/21/13
Impression

 Single IUP at 40w 3d
 Post term pregnancy
 Active fetus with BPP of [DATE]
 Normal amniotic fluid volume
Recommendations

 Continue antepartum fetal testing (2x weekly NSTs with
 weekly AFI)

## 2014-09-07 ENCOUNTER — Ambulatory Visit (INDEPENDENT_AMBULATORY_CARE_PROVIDER_SITE_OTHER): Payer: Medicaid Other | Admitting: Obstetrics and Gynecology

## 2014-09-07 ENCOUNTER — Encounter: Payer: Self-pay | Admitting: Obstetrics and Gynecology

## 2014-09-07 VITALS — BP 130/93 | HR 92 | Ht <= 58 in | Wt 113.0 lb

## 2014-09-07 DIAGNOSIS — Z113 Encounter for screening for infections with a predominantly sexual mode of transmission: Secondary | ICD-10-CM

## 2014-09-07 DIAGNOSIS — Z124 Encounter for screening for malignant neoplasm of cervix: Secondary | ICD-10-CM

## 2014-09-07 DIAGNOSIS — Z01419 Encounter for gynecological examination (general) (routine) without abnormal findings: Secondary | ICD-10-CM | POA: Diagnosis not present

## 2014-09-07 DIAGNOSIS — Z1151 Encounter for screening for human papillomavirus (HPV): Secondary | ICD-10-CM | POA: Diagnosis not present

## 2014-09-07 DIAGNOSIS — Z118 Encounter for screening for other infectious and parasitic diseases: Secondary | ICD-10-CM

## 2014-09-07 NOTE — Progress Notes (Signed)
  Subjective:     Emily Mack is a 24 y.o. female G1P1 with LMP and BMI who is here for a comprehensive physical exam. The patient reports no problems. She is sexually active using condoms for contraception  History   Social History  . Marital Status: Married    Spouse Name: N/A  . Number of Children: N/A  . Years of Education: N/A   Occupational History  . Not on file.   Social History Main Topics  . Smoking status: Never Smoker   . Smokeless tobacco: Never Used  . Alcohol Use: No  . Drug Use: No  . Sexual Activity: Yes   Other Topics Concern  . Not on file   Social History Narrative   Health Maintenance  Topic Date Due  . INFLUENZA VACCINE  11/05/2014  . PAP SMEAR  01/05/2016  . TETANUS/TDAP  06/03/2023  . HIV Screening  Completed   Past Medical History  Diagnosis Date  . Medical history non-contributory    Past Surgical History  Procedure Laterality Date  . Tympanoplasty      age 89  . Cesarean section N/A 08/24/2013    Procedure: CESAREAN SECTION;  Surgeon: Allie BossierMyra C Dove, MD;  Location: [facility];  Service: Obstetrics;  Laterality: N/A;   Family History  Problem Relation Age of Onset  . Diabetes Mother       Review of Systems A comprehensive review of systems was negative.   Objective:      GENERAL: Well-developed, well-nourished female in no acute distress.  HEENT: Normocephalic, atraumatic. Sclerae anicteric.  NECK: Supple. Normal thyroid.  LUNGS: Clear to auscultation bilaterally.  HEART: Regular rate and rhythm. BREASTS: Symmetric in size. No palpable masses or lymphadenopathy, skin changes, or nipple drainage. ABDOMEN: Soft, nontender, nondistended. No organomegaly. PELVIC: Normal external female genitalia. Vagina is pink and rugated.  Normal discharge. Normal appearing cervix. Uterus is normal in size. No adnexal mass or tenderness. EXTREMITIES: No cyanosis, clubbing, or edema, 2+ distal pulses.    Assessment:    Healthy female exam.       Plan:    Pap smear collected Patient will be contacted with any abnormal results Patient advised to perform monthly self breast exam See After Visit Summary for Counseling Recommendations

## 2014-09-11 LAB — CYTOLOGY - PAP

## 2016-06-18 ENCOUNTER — Ambulatory Visit (INDEPENDENT_AMBULATORY_CARE_PROVIDER_SITE_OTHER): Payer: BLUE CROSS/BLUE SHIELD | Admitting: Student

## 2016-06-18 ENCOUNTER — Encounter: Payer: Self-pay | Admitting: Student

## 2016-06-18 DIAGNOSIS — Z3481 Encounter for supervision of other normal pregnancy, first trimester: Secondary | ICD-10-CM

## 2016-06-18 DIAGNOSIS — Z3689 Encounter for other specified antenatal screening: Secondary | ICD-10-CM | POA: Diagnosis not present

## 2016-06-18 DIAGNOSIS — Z3490 Encounter for supervision of normal pregnancy, unspecified, unspecified trimester: Secondary | ICD-10-CM | POA: Insufficient documentation

## 2016-06-18 DIAGNOSIS — Z113 Encounter for screening for infections with a predominantly sexual mode of transmission: Secondary | ICD-10-CM | POA: Diagnosis not present

## 2016-06-18 MED ORDER — DOXYLAMINE-PYRIDOXINE 10-10 MG PO TBEC: 10.0000 mg | DELAYED_RELEASE_TABLET | Freq: Three times a day (TID) | ORAL | 1 refills | Status: DC

## 2016-06-18 NOTE — Patient Instructions (Signed)
Morning Sickness °Morning sickness is when you feel sick to your stomach (nauseous) during pregnancy. This nauseous feeling may or may not come with vomiting. It often occurs in the morning but can be a problem any time of day. Morning sickness is most common during the first trimester, but it may continue throughout pregnancy. While morning sickness is unpleasant, it is usually harmless unless you develop severe and continual vomiting (hyperemesis gravidarum). This condition requires more intense treatment. °What are the causes? °The cause of morning sickness is not completely known but seems to be related to normal hormonal changes that occur in pregnancy. °What increases the risk? °You are at greater risk if you: °· Experienced nausea or vomiting before your pregnancy. °· Had morning sickness during a previous pregnancy. °· Are pregnant with more than one baby, such as twins. ° °How is this treated? °Do not use any medicines (prescription, over-the-counter, or herbal) for morning sickness without first talking to your health care provider. Your health care provider may prescribe or recommend: °· Vitamin B6 supplements. °· Anti-nausea medicines. °· The herbal medicine ginger. ° °Follow these instructions at home: °· Only take over-the-counter or prescription medicines as directed by your health care provider. °· Taking multivitamins before getting pregnant can prevent or decrease the severity of morning sickness in most women. °· Eat a piece of dry toast or unsalted crackers before getting out of bed in the morning. °· Eat five or six small meals a day. °· Eat dry and bland foods (rice, baked potato). Foods high in carbohydrates are often helpful. °· Do not drink liquids with your meals. Drink liquids between meals. °· Avoid greasy, fatty, and spicy foods. °· Get someone to cook for you if the smell of any food causes nausea and vomiting. °· If you feel nauseous after taking prenatal vitamins, take the vitamins at  night or with a snack. °· Snack on protein foods (nuts, yogurt, cheese) between meals if you are hungry. °· Eat unsweetened gelatins for desserts. °· Wearing an acupressure wristband (worn for sea sickness) may be helpful. °· Acupuncture may be helpful. °· Do not smoke. °· Get a humidifier to keep the air in your house free of odors. °· Get plenty of fresh air. °Contact a health care provider if: °· Your home remedies are not working, and you need medicine. °· You feel dizzy or lightheaded. °· You are losing weight. °Get help right away if: °· You have persistent and uncontrolled nausea and vomiting. °· You pass out (faint). °This information is not intended to replace advice given to you by your health care provider. Make sure you discuss any questions you have with your health care provider. °Document Released: 05/14/2006 Document Revised: 08/29/2015 Document Reviewed: 09/07/2012 °Elsevier Interactive Patient Education © 2017 [employer]. ° °

## 2016-06-18 NOTE — Progress Notes (Signed)
  Subjective:    Emily Mack is being seen today for her first obstetrical visit.  This is a planned pregnancy. She is at 9672w4d gestation. Her obstetrical history is significant for nothing. Relationship with FOB: spouse, living together. Patient does intend to breast feed. Pregnancy history fully reviewed.  Patient reports nausea.  Review of Systems:   Review of Systems  Constitutional: Negative.   HENT: Negative.   Respiratory: Negative.   Cardiovascular: Negative.   Gastrointestinal: Positive for nausea.  Endocrine: Negative.   Genitourinary: Negative.   Musculoskeletal: Negative.   Allergic/Immunologic: Negative.   Neurological: Negative.   Hematological: Negative.   Psychiatric/Behavioral: Negative.     Objective:     BP 131/81   Pulse 94   Wt 58.1 kg (128 lb)   LMP 04/19/2016   BMI 26.75 kg/m  Physical Exam  Constitutional: She is oriented to person, place, and time. She appears well-developed and well-nourished.  HENT:  Head: Normocephalic.  Neck: Normal range of motion.  Cardiovascular: Normal rate and regular rhythm.   Respiratory: Effort normal and breath sounds normal.  GI: Soft. Bowel sounds are normal.  Musculoskeletal: Normal range of motion.  Neurological: She is alert and oriented to person, place, and time.  Skin: Skin is warm and dry.    Exam    Assessment:    Pregnancy: G2P1001 Patient Active Problem List   Diagnosis Date Noted  . Supervision of normal pregnancy, antepartum 06/18/2016       Plan:     Initial labs drawn plus cultures CF test Prenatal vitamins. Problem list reviewed and updated. AFP3 discussed: declined. Role of ultrasound in pregnancy discussed; fetal survey: requested. Amniocentesis discussed: not indicated. Follow up in 8 weeks. 50% of 30 min visit spent on counseling and coordination of care.   RX for Diclegis sent to pharmacy  Emily Mack CNM 06/18/2016

## 2016-06-18 NOTE — Assessment & Plan Note (Signed)
BABYSCRIPTS PATIENT: [X] initial, [ ] 12, [ ] 20, [ ] 28, [ ] 32, [ ] 36, [ ] 38, [ ] 39, [ ] 40 

## 2016-06-18 NOTE — Progress Notes (Signed)
Bedside Transvaginal US performed, SIUP noted with + FHR = 175 and CRL measuring 8w 2d which correlates with LMP.

## 2016-06-18 NOTE — Progress Notes (Signed)
Normal Pap 09/2014 Needs flu vaccine Order CF, SMA, and 1st screen if genetic screening desired

## 2016-06-20 LAB — GC/CHLAMYDIA PROBE AMP (~~LOC~~) NOT AT ARMC
CHLAMYDIA, DNA PROBE: NEGATIVE
NEISSERIA GONORRHEA: NEGATIVE

## 2016-06-25 ENCOUNTER — Telehealth: Payer: Self-pay | Admitting: *Deleted

## 2016-06-25 DIAGNOSIS — O219 Vomiting of pregnancy, unspecified: Secondary | ICD-10-CM

## 2016-06-25 MED ORDER — PROMETHAZINE HCL 25 MG PO TABS: 25.0000 mg | ORAL_TABLET | Freq: Four times a day (QID) | ORAL | 1 refills | Status: DC | PRN

## 2016-06-25 NOTE — Telephone Encounter (Signed)
Pt called c/o increased nausea and vomiting, is currently taking Diclegis.  States it had been very severe today and is unable to keep anything down.  Sent Phenergan to the pharmacy and instructed on medication use as well as dehydration precautions.  Encouraged to push fluids once symptoms resided. Pt acknowledged instructions.

## 2016-06-30 ENCOUNTER — Other Ambulatory Visit (HOSPITAL_COMMUNITY): Payer: Self-pay | Admitting: Student

## 2016-06-30 ENCOUNTER — Other Ambulatory Visit: Payer: Self-pay | Admitting: Student

## 2016-06-30 LAB — OBSTETRIC PANEL, INCLUDING HIV
Antibody Screen: NEGATIVE
Basophils Absolute: 0 10*3/uL (ref 0.0–0.2)
Basos: 0 %
EOS (ABSOLUTE): 0 10*3/uL (ref 0.0–0.4)
EOS: 0 %
HEMOGLOBIN: 12.5 g/dL (ref 11.1–15.9)
HEP B S AG: NEGATIVE
HIV Screen 4th Generation wRfx: NONREACTIVE
Hematocrit: 37.5 % (ref 34.0–46.6)
IMMATURE GRANULOCYTES: 0 %
Immature Grans (Abs): 0 10*3/uL (ref 0.0–0.1)
LYMPHS ABS: 1.7 10*3/uL (ref 0.7–3.1)
Lymphs: 13 %
MCH: 28.6 pg (ref 26.6–33.0)
MCHC: 33.3 g/dL (ref 31.5–35.7)
MCV: 86 fL (ref 79–97)
MONOS ABS: 0.7 10*3/uL (ref 0.1–0.9)
Monocytes: 5 %
Neutrophils Absolute: 11.1 10*3/uL — ABNORMAL HIGH (ref 1.4–7.0)
Neutrophils: 82 %
Platelets: 299 10*3/uL (ref 150–379)
RBC: 4.37 x10E6/uL (ref 3.77–5.28)
RDW: 13.2 % (ref 12.3–15.4)
RH TYPE: POSITIVE
RPR: NONREACTIVE
Rubella Antibodies, IGG: 3.4 index (ref >0.99)
WBC: 13.7 10*3/uL — ABNORMAL HIGH (ref 3.4–10.8)

## 2016-06-30 LAB — URINE CULTURE, OB REFLEX

## 2016-06-30 LAB — SMN1 COPY NUMBER ANALYSIS (SMA CARRIER SCREENING)

## 2016-06-30 LAB — HEMOGLOBINOPATHY EVALUATION
HEMOGLOBIN A2 QUANTITATION: 2.6 % (ref 1.8–3.2)
HGB A: 97.4 % (ref 96.4–98.8)
HGB C: 0 %
HGB S: 0 %
HGB VARIANT: 0 %
Hemoglobin F Quantitation: 0 % (ref 0.0–2.0)

## 2016-06-30 LAB — CYSTIC FIBROSIS MUTATION 97

## 2016-06-30 LAB — HEMOGLOBIN A1C
ESTIMATED AVERAGE GLUCOSE: 105 mg/dL
Hgb A1c MFr Bld: 5.3 % (ref 4.8–5.6)

## 2016-06-30 LAB — CULTURE, OB URINE

## 2016-07-01 ENCOUNTER — Encounter: Payer: Self-pay | Admitting: Student

## 2016-07-01 ENCOUNTER — Telehealth: Payer: Self-pay | Admitting: *Deleted

## 2016-07-01 DIAGNOSIS — O99891 Other specified diseases and conditions complicating pregnancy: Secondary | ICD-10-CM

## 2016-07-01 DIAGNOSIS — O9989 Other specified diseases and conditions complicating pregnancy, childbirth and the puerperium: Secondary | ICD-10-CM

## 2016-07-01 DIAGNOSIS — R8271 Bacteriuria: Secondary | ICD-10-CM

## 2016-07-01 DIAGNOSIS — Z141 Cystic fibrosis carrier: Secondary | ICD-10-CM

## 2016-07-01 DIAGNOSIS — O2340 Unspecified infection of urinary tract in pregnancy, unspecified trimester: Secondary | ICD-10-CM

## 2016-07-01 HISTORY — DX: Bacteriuria: R82.71

## 2016-07-01 HISTORY — DX: Other specified diseases and conditions complicating pregnancy: O99.891

## 2016-07-01 HISTORY — DX: Cystic fibrosis carrier: Z14.1

## 2016-07-01 MED ORDER — CEPHALEXIN 500 MG PO CAPS: 500.0000 mg | ORAL_CAPSULE | Freq: Three times a day (TID) | ORAL | 0 refills | Status: DC

## 2016-07-01 NOTE — Telephone Encounter (Signed)
Spoke to The KrogerKathryn Kooistra CNM about abnormal CF screen and UTI. Ordered MFM GC consult for CF screen and Keflex for UTI per v/o. LM for pt to rtn call to go over results.

## 2016-07-07 ENCOUNTER — Encounter: Payer: Self-pay | Admitting: *Deleted

## 2016-07-08 ENCOUNTER — Telehealth: Payer: Self-pay | Admitting: *Deleted

## 2016-07-08 NOTE — Telephone Encounter (Signed)
-----   Message from Marylene Land, CNM sent at 07/01/2016  1:19 AM EDT ----- Regarding: genetic testing and keflex Hey y'all-do you mind calling this patient and talking to her about her CF screening results (she is a carrier).  I am not sure if she wants to get her husband tested or not; I believe without insurance the genetic test is $500.    Do you mind also sending in an RX for keflex for her for 500 mg QID for 7 days for asymptomatic bacteriuria? For some reason, when I try to send it in I keep getting that it needs to be printed out.  Thank you! See you on Thursday!

## 2016-07-08 NOTE — Telephone Encounter (Signed)
Called pt, no answer, left message to call the office.  

## 2016-07-16 ENCOUNTER — Ambulatory Visit (INDEPENDENT_AMBULATORY_CARE_PROVIDER_SITE_OTHER): Payer: BLUE CROSS/BLUE SHIELD | Admitting: Obstetrics and Gynecology

## 2016-07-16 VITALS — BP 137/81 | HR 97 | Wt 130.0 lb

## 2016-07-16 DIAGNOSIS — Z3401 Encounter for supervision of normal first pregnancy, first trimester: Secondary | ICD-10-CM

## 2016-07-16 DIAGNOSIS — Z3480 Encounter for supervision of other normal pregnancy, unspecified trimester: Secondary | ICD-10-CM

## 2016-07-16 DIAGNOSIS — O9989 Other specified diseases and conditions complicating pregnancy, childbirth and the puerperium: Secondary | ICD-10-CM

## 2016-07-16 DIAGNOSIS — R8271 Bacteriuria: Secondary | ICD-10-CM

## 2016-07-16 DIAGNOSIS — Z141 Cystic fibrosis carrier: Secondary | ICD-10-CM

## 2016-07-16 DIAGNOSIS — Z3481 Encounter for supervision of other normal pregnancy, first trimester: Secondary | ICD-10-CM

## 2016-07-16 DIAGNOSIS — O99891 Other specified diseases and conditions complicating pregnancy: Secondary | ICD-10-CM

## 2016-07-16 NOTE — Patient Instructions (Signed)
He should ask his doctor about getting a blood test for cystic fibrosis and the delta f508 mutation and let us know the results.

## 2016-07-16 NOTE — Progress Notes (Signed)
Prenatal Visit Note Date: 07/16/2016 Clinic: [facility]  Subjective:  Alexismarie Flaim is a 26 y.o. G2P1001 at [redacted]w[redacted]d being seen today for ongoing prenatal care.  She is currently monitored for the following issues for this low-risk pregnancy and has Supervision of normal pregnancy, antepartum; Cystic fibrosis carrier; and Asymptomatic bacteriuria during pregnancy on her problem list.  Patient reports no complaints.   Contractions: Not present. Vag. Bleeding: None.  Movement: Absent. Denies leaking of fluid.   The following portions of the patient's history were reviewed and updated as appropriate: allergies, current medications, past family history, past medical history, past social history, past surgical history and problem list. Problem list updated.  Objective:   Vitals:   07/16/16 0819  BP: 137/81  Pulse: 97  Weight: 130 lb (59 kg)    Fetal Status: Fetal Heart Rate (bpm): 156   Movement: Absent     General:  Alert, oriented and cooperative. Patient is in no acute distress.  Skin: Skin is warm and dry. No rash noted.   Cardiovascular: Normal heart rate noted  Respiratory: Normal respiratory effort, no problems with respiration noted  Abdomen: Soft, gravid, appropriate for gestational age. Pain/Pressure: Absent     Pelvic:  Cervical exam deferred        Extremities: Normal range of motion.  Edema: None  Mental Status: Normal mood and affect. Normal behavior. Normal judgment and thought content.   Urinalysis:      Assessment and Plan:  Pregnancy: G2P1001 at [redacted]w[redacted]d  1. Encounter for supervision of normal first pregnancy in first trimester Routine care. Declines genetic screening. Morning sickness s/s stable. Able to stay hydrated. Pt on tid diclegis.  - [place] MFM OB COMP + 14 WK; Future  2. Cystic fibrosis carrier D/w pt. States he's the FOB of other child and that child is negative. He's caucasian. He has insurance and to ask his MD about getting tested; pt  to let us know any problems or issues.   3. Asymptomatic bacteriuria during pregnancy Informed for +UCx. Pt to start meds today  Preterm labor symptoms and general obstetric precautions including but not limited to vaginal bleeding, contractions, leaking of fluid and fetal movement were reviewed in detail with the patient. Please refer to After Visit Summary for other counseling recommendations.  Return for per baby scripts.   Sherwood Bing, MD

## 2016-08-03 ENCOUNTER — Telehealth: Payer: Self-pay | Admitting: *Deleted

## 2016-08-03 NOTE — Telephone Encounter (Signed)
Pt called and stated husband physician called and informed him that his CF Screen was negative and he is not a carrier.  Asked pt to get copy and bring to next office visit.  Pt acknowledged instructions.

## 2016-08-03 NOTE — Telephone Encounter (Signed)
-----   Message from Lindell Spar, [place] sent at 08/03/2016 10:09 AM EDT ----- Regarding: test results Contact: [phone] Pt would like to report test results from testing of her husband

## 2016-08-20 ENCOUNTER — Other Ambulatory Visit: Payer: Self-pay | Admitting: Obstetrics and Gynecology

## 2016-08-20 ENCOUNTER — Ambulatory Visit (HOSPITAL_COMMUNITY)
Admission: RE | Admit: 2016-08-20 | Discharge: 2016-08-20 | Disposition: A | Discharge status: Home / Self Care | Payer: BLUE CROSS/BLUE SHIELD | Source: Ambulatory Visit | Attending: Obstetrics and Gynecology | Admitting: Obstetrics and Gynecology

## 2016-08-20 ENCOUNTER — Ambulatory Visit (HOSPITAL_COMMUNITY)
Admission: RE | Admit: 2016-08-20 | Discharge: 2016-08-20 | Disposition: A | Discharge status: Home / Self Care | Payer: BLUE CROSS/BLUE SHIELD | Source: Ambulatory Visit | Attending: Student | Admitting: Student

## 2016-08-20 ENCOUNTER — Encounter (HOSPITAL_COMMUNITY): Payer: Self-pay

## 2016-08-20 ENCOUNTER — Ambulatory Visit (HOSPITAL_COMMUNITY): Payer: BLUE CROSS/BLUE SHIELD

## 2016-08-20 DIAGNOSIS — Z3A17 17 weeks gestation of pregnancy: Secondary | ICD-10-CM | POA: Insufficient documentation

## 2016-08-20 DIAGNOSIS — Z141 Cystic fibrosis carrier: Secondary | ICD-10-CM | POA: Diagnosis not present

## 2016-08-20 DIAGNOSIS — O34219 Maternal care for unspecified type scar from previous cesarean delivery: Secondary | ICD-10-CM

## 2016-08-20 DIAGNOSIS — Z3480 Encounter for supervision of other normal pregnancy, unspecified trimester: Secondary | ICD-10-CM

## 2016-08-20 DIAGNOSIS — Z3401 Encounter for supervision of normal first pregnancy, first trimester: Secondary | ICD-10-CM

## 2016-08-20 DIAGNOSIS — Z363 Encounter for antenatal screening for malformations: Secondary | ICD-10-CM

## 2016-08-20 NOTE — Addendum Note (Signed)
Encounter addended by: Tommie Raymondester, Lashunta Frieden T on: 08/20/2016  4:38 PM<BR>    Actions taken: Imaging Exam ended

## 2016-08-21 ENCOUNTER — Other Ambulatory Visit (HOSPITAL_COMMUNITY): Payer: Self-pay | Admitting: *Deleted

## 2016-08-21 ENCOUNTER — Encounter: Payer: Self-pay | Admitting: Obstetrics and Gynecology

## 2016-08-21 DIAGNOSIS — O34219 Maternal care for unspecified type scar from previous cesarean delivery: Secondary | ICD-10-CM

## 2016-08-21 DIAGNOSIS — IMO0002 Reserved for concepts with insufficient information to code with codable children: Secondary | ICD-10-CM

## 2016-08-21 DIAGNOSIS — Z0489 Encounter for examination and observation for other specified reasons: Secondary | ICD-10-CM

## 2016-08-21 DIAGNOSIS — Z98891 History of uterine scar from previous surgery: Secondary | ICD-10-CM | POA: Insufficient documentation

## 2016-08-21 DIAGNOSIS — O4442 Low lying placenta NOS or without hemorrhage, second trimester: Secondary | ICD-10-CM | POA: Insufficient documentation

## 2016-08-26 ENCOUNTER — Ambulatory Visit (INDEPENDENT_AMBULATORY_CARE_PROVIDER_SITE_OTHER): Payer: BLUE CROSS/BLUE SHIELD | Admitting: Obstetrics & Gynecology

## 2016-08-26 VITALS — BP 135/85 | HR 99 | Wt 135.0 lb

## 2016-08-26 DIAGNOSIS — R8271 Bacteriuria: Secondary | ICD-10-CM

## 2016-08-26 DIAGNOSIS — O4442 Low lying placenta NOS or without hemorrhage, second trimester: Secondary | ICD-10-CM

## 2016-08-26 DIAGNOSIS — Z3480 Encounter for supervision of other normal pregnancy, unspecified trimester: Secondary | ICD-10-CM

## 2016-08-26 DIAGNOSIS — O34219 Maternal care for unspecified type scar from previous cesarean delivery: Secondary | ICD-10-CM

## 2016-08-26 DIAGNOSIS — Z3482 Encounter for supervision of other normal pregnancy, second trimester: Secondary | ICD-10-CM

## 2016-08-26 DIAGNOSIS — O9989 Other specified diseases and conditions complicating pregnancy, childbirth and the puerperium: Secondary | ICD-10-CM

## 2016-08-26 NOTE — Progress Notes (Signed)
PRENATAL VISIT NOTE  Subjective:  Emily Mack is a 26 y.o. G2P1001 at [redacted]w[redacted]d being seen today for ongoing prenatal care.  She is currently monitored for the following issues for this low-risk pregnancy and has Supervision of normal pregnancy, antepartum; Cystic fibrosis carrier; Asymptomatic bacteriuria during pregnancy; Low lying posterior placenta without hemorrhage, second trimester; and Previous cesarean delivery, antepartum on her problem list.  Patient reports no complaints.  Contractions: Not present. Vag. Bleeding: None.  Movement: Present. Denies leaking of fluid.   The following portions of the patient's history were reviewed and updated as appropriate: allergies, current medications, past family history, past medical history, past social history, past surgical history and problem list. Problem list updated.  Objective:   Vitals:   08/26/16 0901  BP: 135/85  Pulse: 99  Weight: 135 lb (61.2 kg)    Fetal Status: Fetal Heart Rate (bpm): 153   Movement: Present     General:  Alert, oriented and cooperative. Patient is in no acute distress.  Skin: Skin is warm and dry. No rash noted.   Cardiovascular: Normal heart rate noted  Respiratory: Normal respiratory effort, no problems with respiration noted  Abdomen: Soft, gravid, appropriate for gestational age. Pain/Pressure: Absent     Pelvic:  Cervical exam deferred        Extremities: Normal range of motion.  Edema: None  Mental Status: Normal mood and affect. Normal behavior. Normal judgment and thought content.   [place] Mfm Ob Detail +14 Wk  Result Date: 08/20/2016 ----------------------------------------------------------------------  OBSTETRICS REPORT                      (Signed Final 08/20/2016 04:19 pm) ---------------------------------------------------------------------- Patient Info  ID #:       [id number]                         D.O.B.:   06-Aug-1990 (25 yrs)  Name:       Emily Mack               Visit Date:   08/20/2016 02:48 pm ---------------------------------------------------------------------- Performed By  Performed By:     Tommie Raymond BS,       Secondary Phy.:   [facility]                                                             [facility]                                                             [facility]                                                             Healthcare  Attending:  Charlsie Merles MD         Address:          [address]                                                             [address]  Referred By:      Billey Gosling                Location:         [facility]  Ref. Address:     [address]                    [place], [place]                    [address] ---------------------------------------------------------------------- Orders   #  Description                                 Code   1  [place] MFM OB DETAIL +14 WK                     60454.09  ----------------------------------------------------------------------   #  Ordered By               Order #        Accession #    Episode #   1  [place] Bing          [id number]      [id number]     [id number]  ---------------------------------------------------------------------- Indications   [redacted] weeks gestation of pregnancy                Z3A.17   Encounter for antenatal screening for          Z36.3   malformations   Genetic carrier (CF)                           Z14.8   Previous cesarean delivery, antepartum         O34.219  ---------------------------------------------------------------------- OB History  Blood Type:            Height:  4'10"  Weight (lb):  130      BMI:   27.17  Gravidity:    2         Term:   1        Prem:   0        SAB:   0  TOP:          0       Ectopic:  0        Living: 1 ---------------------------------------------------------------------- Fetal Evaluation  Num Of Fetuses:     1  Fetal Heart         164  Rate(bpm):  Cardiac  Activity:   Observed  Presentation:       Breech  Placenta:  Posterior Low-lying, 1.6cm from int os  P. Cord Insertion:  Visualized, central  Amniotic Fluid  AFI FV:      Subjectively within normal limits                              Largest Pocket(cm)                              4.2 ---------------------------------------------------------------------- Biometry  BPD:      38.3  mm     G. Age:  17w 5d         55  %    CI:        74.26   %   70 - 86                                                          FL/HC:      16.4   %   15.8 - 18  HC:      141.1  mm     G. Age:  17w 3d         34  %    HC/AC:      1.12       1.07 - 1.29  AC:      125.8  mm     G. Age:  18w 1d         69  %    FL/BPD:     60.6   %  FL:       23.2  mm     G. Age:  17w 0d         24  %    FL/AC:      18.4   %   20 - 24  HUM:      23.2  mm     G. Age:  17w 1d         44  %  CER:      17.8  mm     G. Age:  17w 6d         54  %  NFT:       3.6  mm  CM:          4  mm  Est. FW:     201  gm      0 lb 7 oz     52  % ---------------------------------------------------------------------- Gestational Age  LMP:           17w 4d       Date:   04/19/16                 EDD:   01/24/17  U/S Today:     17w 4d                                        EDD:   01/24/17  Best:          17w 4d    Det. By:   LMP  (04/19/16)          EDD:   01/24/17 ----------------------------------------------------------------------  Anatomy  Cranium:               Appears normal         Aortic Arch:            Appears normal  Cavum:                 Appears normal         Ductal Arch:            Appears normal  Ventricles:            Appears normal         Diaphragm:              Appears normal  Choroid Plexus:        Appears normal         Stomach:                Appears normal, left                                                                        sided  Cerebellum:            Appears normal         Abdomen:                Appears normal  Posterior Fossa:        Appears normal         Abdominal Wall:         Appears nml (cord                                                                        insert, abd wall)  Nuchal Fold:           Appears normal         Cord Vessels:           Appears normal (3                                                                        vessel cord)  Face:                  Appears normal         Kidneys:                Right sided                         (orbits and profile)  pyelectasis, 4 mm  Lips:                  Appears normal         Bladder:                Appears normal  Thoracic:              Appears normal         Spine:                  Appears normal  Heart:                 Appears normal         Upper Extremities:      Appears normal                         (4CH, axis, and situs  RVOT:                  Appears normal         Lower Extremities:      Appears normal  LVOT:                  Appears normal  Other:  Parents do not wish to know sex of fetus. Fetus appears to be a female.          Heels visualized. Technically difficult due to early gestational age.          Technically difficult due to fetal position. ---------------------------------------------------------------------- Cervix Uterus Adnexa  Cervix  Length:           4.66  cm.  Normal appearance by transabdominal scan.  Uterus  No abnormality visualized.  Left Ovary  Not visualized. No adnexal mass visualized.  Right Ovary  Size(cm)     2.38  x    2.95   x  2.49      Vol(ml): 9.2  Simple cyst, measuring 1.9 x 1.8 x 2.0 cm.  Cul De Sac:   No free fluid seen. ---------------------------------------------------------------------- Impression  Singleton intrauterine pregnancy at 17+4 weeks with cystic  fibrosis mutation (carrier). Here for anatomic survey and  genetic counseling  Review of the anatomy shows no sonographic markers for  aneuploidy or structural anomalies  However, evaluations should be  considered suboptimal  secondary to early gestational age  Amniotic fluid volume is normal  Estimated fetal weight is 201g which is growth in the 52nd  percentile ---------------------------------------------------------------------- Recommendations  The patient declined genetic counseling as the father of the  baby has been tested and is not a CF carrier. I have asked  her to return in 4 weeks to complete the anatomic survey ----------------------------------------------------------------------                 Charlsie Merles, MD Electronically Signed Final Report   08/20/2016 04:19 pm ----------------------------------------------------------------------   Assessment and Plan:  Pregnancy: G2P1001 at [redacted]w[redacted]d  1. Low lying placenta nos or without hemorrhage, second trimester 1.6 cm from os, no bleeding. Will reevaluate during scan on 09/17/16.  2. Asymptomatic bacteriuria during pregnancy Had E.coli ASB, TOC today. - Culture, OB Urine  3. Previous cesarean delivery, antepartum Desires RCS.  4. Supervision of other normal pregnancy, antepartum Anatomy scan reviewed, was suboptimal. Rescan on 6/14.  Still declines genetic screening. No other complaints or concerns.  Routine obstetric precautions reviewed. Please refer to After Visit Summary for other counseling recommendations.  Return in about 4 weeks (around 09/23/2016) for OB Visit.   Jaynie CollinsUgonna Estefana Taylor, MD

## 2016-08-26 NOTE — Progress Notes (Signed)
Pt states she thinks she might have gotten a yeast infection the last two days of taking Keflex but Sx have resolved today

## 2016-08-26 NOTE — Patient Instructions (Addendum)
Return to clinic for any scheduled appointments or obstetric concerns, or go to [facility] for evaluation   Intrauterine Device Information An intrauterine device (IUD) is inserted into your uterus to prevent pregnancy. There are two types of IUDs available:  Copper IUD-This type of IUD is wrapped in copper wire and is placed inside the uterus. Copper makes the uterus and fallopian tubes produce a fluid that kills sperm. The copper IUD can stay in place for 10 years.  Hormone IUD-This type of IUD contains the hormone progestin (synthetic progesterone). The hormone thickens the cervical mucus and prevents sperm from entering the uterus. It also thins the uterine lining to prevent implantation of a fertilized egg. The hormone can weaken or kill the sperm that get into the uterus. One type of hormone IUD can stay in place for 5 years, and another type can stay in place for 3 years.  Your health care provider will make sure you are a good candidate for a contraceptive IUD. Discuss with your health care provider the possible side effects. Advantages of an intrauterine device  IUDs are highly effective, reversible, long acting, and low maintenance.  There are no estrogen-related side effects.  An IUD can be used when breastfeeding.  IUDs are not associated with weight gain.  The copper IUD works immediately after insertion.  The hormone IUD works right away if inserted within 7 days of your period starting. You will need to use a backup method of birth control for 7 days if the hormone IUD is inserted at any other time in your cycle.  The copper IUD does not interfere with your female hormones.  The hormone IUD can make heavy menstrual periods lighter and decrease cramping.  The hormone IUD can be used for 3 or 5 years.  The copper IUD can be used for 10 years. Disadvantages of an intrauterine device  The hormone IUD can be associated with irregular bleeding patterns.  The copper IUD can make  your menstrual flow heavier and more painful.  You may experience cramping and vaginal bleeding after insertion. This information is not intended to replace advice given to you by your health care provider. Make sure you discuss any questions you have with your health care provider. Document Released: 02/25/2004 Document Revised: 08/29/2015 Document Reviewed: 09/11/2012 Elsevier Interactive Patient Education  2017 Elsevier Inc.    

## 2016-08-30 LAB — CULTURE, OB URINE

## 2016-08-30 LAB — URINE CULTURE, OB REFLEX

## 2016-09-17 ENCOUNTER — Ambulatory Visit (HOSPITAL_COMMUNITY)
Admission: RE | Admit: 2016-09-17 | Discharge: 2016-09-17 | Disposition: A | Discharge status: Home / Self Care | Payer: BLUE CROSS/BLUE SHIELD | Source: Ambulatory Visit | Attending: Obstetrics and Gynecology | Admitting: Obstetrics and Gynecology

## 2016-09-17 ENCOUNTER — Encounter (HOSPITAL_COMMUNITY): Payer: Self-pay

## 2016-09-17 DIAGNOSIS — Z3A21 21 weeks gestation of pregnancy: Secondary | ICD-10-CM | POA: Diagnosis not present

## 2016-09-17 DIAGNOSIS — O283 Abnormal ultrasonic finding on antenatal screening of mother: Secondary | ICD-10-CM | POA: Insufficient documentation

## 2016-09-17 DIAGNOSIS — O4440 Low lying placenta NOS or without hemorrhage, unspecified trimester: Secondary | ICD-10-CM | POA: Insufficient documentation

## 2016-09-17 DIAGNOSIS — Z362 Encounter for other antenatal screening follow-up: Secondary | ICD-10-CM | POA: Diagnosis not present

## 2016-09-17 DIAGNOSIS — O4442 Low lying placenta NOS or without hemorrhage, second trimester: Secondary | ICD-10-CM

## 2016-09-17 DIAGNOSIS — O34219 Maternal care for unspecified type scar from previous cesarean delivery: Secondary | ICD-10-CM | POA: Diagnosis not present

## 2016-09-17 DIAGNOSIS — Z3480 Encounter for supervision of other normal pregnancy, unspecified trimester: Secondary | ICD-10-CM

## 2016-09-17 DIAGNOSIS — O358XX0 Maternal care for other (suspected) fetal abnormality and damage, not applicable or unspecified: Secondary | ICD-10-CM | POA: Diagnosis not present

## 2016-09-17 DIAGNOSIS — Z0489 Encounter for examination and observation for other specified reasons: Secondary | ICD-10-CM

## 2016-09-17 DIAGNOSIS — IMO0002 Reserved for concepts with insufficient information to code with codable children: Secondary | ICD-10-CM

## 2016-09-17 NOTE — ED Notes (Signed)
Pt cursing at RN due to 26yo child not being able to come in ultrasound in MFM.Option given to family to either have FOB stay with child or reschedule appt.   Family decided to have FOB to remain in waiting room with child.  Pt states she was not told children 12yo and under not allowed. Rules of policy left on message yesterday on reminder call.   RN explained policy and why restrictions are in place.  Pt verbalized understanding but continued to curse for future appts.

## 2016-09-18 ENCOUNTER — Other Ambulatory Visit (HOSPITAL_COMMUNITY): Payer: Self-pay | Admitting: *Deleted

## 2016-09-18 DIAGNOSIS — O358XX0 Maternal care for other (suspected) fetal abnormality and damage, not applicable or unspecified: Secondary | ICD-10-CM

## 2016-09-18 DIAGNOSIS — O35EXX0 Maternal care for other (suspected) fetal abnormality and damage, fetal genitourinary anomalies, not applicable or unspecified: Secondary | ICD-10-CM

## 2016-09-23 ENCOUNTER — Encounter: Payer: BLUE CROSS/BLUE SHIELD | Admitting: Obstetrics and Gynecology

## 2016-10-12 ENCOUNTER — Encounter: Payer: Self-pay | Admitting: Family Medicine

## 2016-10-12 ENCOUNTER — Ambulatory Visit (INDEPENDENT_AMBULATORY_CARE_PROVIDER_SITE_OTHER): Payer: BLUE CROSS/BLUE SHIELD | Admitting: Family Medicine

## 2016-10-12 VITALS — BP 120/85 | HR 103 | Wt 140.0 lb

## 2016-10-12 DIAGNOSIS — O4442 Low lying placenta NOS or without hemorrhage, second trimester: Secondary | ICD-10-CM

## 2016-10-12 DIAGNOSIS — O359XX0 Maternal care for (suspected) fetal abnormality and damage, unspecified, not applicable or unspecified: Secondary | ICD-10-CM

## 2016-10-12 DIAGNOSIS — O34219 Maternal care for unspecified type scar from previous cesarean delivery: Secondary | ICD-10-CM

## 2016-10-12 DIAGNOSIS — Z3482 Encounter for supervision of other normal pregnancy, second trimester: Secondary | ICD-10-CM

## 2016-10-12 DIAGNOSIS — Z3480 Encounter for supervision of other normal pregnancy, unspecified trimester: Secondary | ICD-10-CM

## 2016-10-12 NOTE — Progress Notes (Signed)
   PRENATAL VISIT NOTE  Subjective:  Emily Mack is a 68[name] y.o. G2P1001 at 6866w1d being seen today for ongoing prenatal care.  She is currently monitored for the following issues for this high-risk pregnancy and has Supervision of normal pregnancy, antepartum; Cystic fibrosis carrier; Asymptomatic bacteriuria during pregnancy; and Previous cesarean delivery, antepartum on her problem list.  Patient reports no complaints.  Contractions: Not present. Vag. Bleeding: None.  Movement: Present. Denies leaking of fluid.   The following portions of the patient's history were reviewed and updated as appropriate: allergies, current medications, past family history, past medical history, past social history, past surgical history and problem list. Problem list updated.  Objective:   Vitals:   10/12/16 1527  BP: 120/85  Pulse: (!) 103  Weight: 140 lb (63.5 kg)    Fetal Status: Fetal Heart Rate (bpm): 148   Movement: Present     General:  Alert, oriented and cooperative. Patient is in no acute distress.  Skin: Skin is warm and dry. No rash noted.   Cardiovascular: Normal heart rate noted  Respiratory: Normal respiratory effort, no problems with respiration noted  Abdomen: Soft, gravid, appropriate for gestational age. Pain/Pressure: Absent     Pelvic:  Cervical exam deferred        Extremities: Normal range of motion.  Edema: None  Mental Status: Normal mood and affect. Normal behavior. Normal judgment and thought content.   Assessment and Plan:  Pregnancy: G2P1001 at 3966w1d  1. Supervision of other normal pregnancy, antepartum FHT and FH  2. Previous cesarean delivery, antepartum Plans repeat  3. Low lying posterior placenta without hemorrhage, second trimester resolved  4. Fetal abnormality affecting management of mother, antepartum, single or unspecified fetus Bilateral UTD A1. Repeat US in August   Preterm labor symptoms and general obstetric precautions including but not  limited to vaginal bleeding, contractions, leaking of fluid and fetal movement were reviewed in detail with the patient. Please refer to After Visit Summary for other counseling recommendations.  Return in about 3 weeks (around 11/02/2016) for OB f/u.   Levie HeritageJacob J Quinterious Walraven, DO

## 2016-11-04 ENCOUNTER — Ambulatory Visit (INDEPENDENT_AMBULATORY_CARE_PROVIDER_SITE_OTHER): Payer: BLUE CROSS/BLUE SHIELD | Admitting: Obstetrics & Gynecology

## 2016-11-04 VITALS — BP 124/82 | HR 96 | Wt 144.0 lb

## 2016-11-04 DIAGNOSIS — Z3483 Encounter for supervision of other normal pregnancy, third trimester: Secondary | ICD-10-CM

## 2016-11-04 DIAGNOSIS — R8271 Bacteriuria: Secondary | ICD-10-CM

## 2016-11-04 DIAGNOSIS — Z23 Encounter for immunization: Secondary | ICD-10-CM

## 2016-11-04 DIAGNOSIS — O9989 Other specified diseases and conditions complicating pregnancy, childbirth and the puerperium: Secondary | ICD-10-CM

## 2016-11-04 DIAGNOSIS — O34219 Maternal care for unspecified type scar from previous cesarean delivery: Secondary | ICD-10-CM

## 2016-11-04 DIAGNOSIS — O99891 Other specified diseases and conditions complicating pregnancy: Secondary | ICD-10-CM

## 2016-11-04 DIAGNOSIS — Z3480 Encounter for supervision of other normal pregnancy, unspecified trimester: Secondary | ICD-10-CM | POA: Diagnosis not present

## 2016-11-04 DIAGNOSIS — O99830 Other infection carrier state complicating pregnancy: Secondary | ICD-10-CM

## 2016-11-04 NOTE — Progress Notes (Signed)
   PRENATAL VISIT NOTE  Subjective:  Emily Mack is a 26 y.o. G2P1001 at 4061w3d being seen today for ongoing prenatal care.  She is currently monitored for the following issues for this low-risk pregnancy and has Supervision of normal pregnancy, antepartum; Cystic fibrosis carrier; Asymptomatic bacteriuria during pregnancy; Previous cesarean delivery, antepartum; and Fetal abnormality affecting management of mother, antepartum on her problem list.  Patient reports no complaints.  Contractions: Not present. Vag. Bleeding: None.  Movement: Present. Denies leaking of fluid.   The following portions of the patient's history were reviewed and updated as appropriate: allergies, current medications, past family history, past medical history, past social history, past surgical history and problem list. Problem list updated.  Objective:   Vitals:   11/04/16 0819  BP: 124/82  Pulse: 96  Weight: 65.3 kg (144 lb)    Fetal Status: Fetal Heart Rate (bpm): 145   Movement: Present     General:  Alert, oriented and cooperative. Patient is in no acute distress.  Skin: Skin is warm and dry. No rash noted.   Cardiovascular: Normal heart rate noted  Respiratory: Normal respiratory effort, no problems with respiration noted  Abdomen: Soft, gravid, appropriate for gestational age.  Pain/Pressure: Absent     Pelvic: Cervical exam deferred        Extremities: Normal range of motion.  Edema: None  Mental Status:  Normal mood and affect. Normal behavior. Normal judgment and thought content.   Assessment and Plan:  Pregnancy: G2P1001 at 8561w3d  1. Supervision of other normal pregnancy, antepartum  - Tdap vaccine greater than or equal to 7yo IM - HIV antibody - RPR - Glucose Tolerance, 2 Hours w/1 Hour - CBC  2. Previous cesarean delivery, antepartum - She wants a RLTCS. I sent Rhea BleacherJacinda an email to schedule this with me at [redacted] weeks EGA (on a Sunday)  3. Asymptomatic bacteriuria during  pregnancy   Preterm labor symptoms and general obstetric precautions including but not limited to vaginal bleeding, contractions, leaking of fluid and fetal movement were reviewed in detail with the patient. Please refer to After Visit Summary for other counseling recommendations.  No Follow-up on file.   Allie BossierMyra C Ilyaas Musto, MD

## 2016-11-05 LAB — CBC
Hematocrit: 34.7 % (ref 34.0–46.6)
Hemoglobin: 11.3 g/dL (ref 11.1–15.9)
MCH: 28.5 pg (ref 26.6–33.0)
MCHC: 32.6 g/dL (ref 31.5–35.7)
MCV: 88 fL (ref 79–97)
PLATELETS: 308 10*3/uL (ref 150–379)
RBC: 3.96 x10E6/uL (ref 3.77–5.28)
RDW: 13.6 % (ref 12.3–15.4)
WBC: 13.2 10*3/uL — AB (ref 3.4–10.8)

## 2016-11-05 LAB — GLUCOSE TOLERANCE, 2 HOURS W/ 1HR
GLUCOSE, 2 HOUR: 128 mg/dL (ref 65–152)
Glucose, 1 hour: 127 mg/dL (ref 65–179)
Glucose, Fasting: 86 mg/dL (ref 65–91)

## 2016-11-05 LAB — HIV ANTIBODY (ROUTINE TESTING W REFLEX): HIV Screen 4th Generation wRfx: NONREACTIVE

## 2016-11-05 LAB — RPR: RPR Ser Ql: NONREACTIVE

## 2016-11-13 ENCOUNTER — Ambulatory Visit (HOSPITAL_COMMUNITY)
Admission: RE | Admit: 2016-11-13 | Discharge: 2016-11-13 | Disposition: A | Discharge status: Home / Self Care | Payer: BLUE CROSS/BLUE SHIELD | Source: Ambulatory Visit | Attending: Obstetrics and Gynecology | Admitting: Obstetrics and Gynecology

## 2016-11-13 ENCOUNTER — Encounter (HOSPITAL_COMMUNITY): Payer: Self-pay

## 2016-11-13 DIAGNOSIS — O321XX0 Maternal care for breech presentation, not applicable or unspecified: Secondary | ICD-10-CM | POA: Insufficient documentation

## 2016-11-13 DIAGNOSIS — Z362 Encounter for other antenatal screening follow-up: Secondary | ICD-10-CM | POA: Insufficient documentation

## 2016-11-13 DIAGNOSIS — O283 Abnormal ultrasonic finding on antenatal screening of mother: Secondary | ICD-10-CM | POA: Insufficient documentation

## 2016-11-13 DIAGNOSIS — O358XX0 Maternal care for other (suspected) fetal abnormality and damage, not applicable or unspecified: Secondary | ICD-10-CM

## 2016-11-13 DIAGNOSIS — O34219 Maternal care for unspecified type scar from previous cesarean delivery: Secondary | ICD-10-CM | POA: Insufficient documentation

## 2016-11-13 DIAGNOSIS — Z3480 Encounter for supervision of other normal pregnancy, unspecified trimester: Secondary | ICD-10-CM

## 2016-11-13 DIAGNOSIS — Z3A29 29 weeks gestation of pregnancy: Secondary | ICD-10-CM | POA: Diagnosis not present

## 2016-11-13 DIAGNOSIS — O35EXX0 Maternal care for other (suspected) fetal abnormality and damage, fetal genitourinary anomalies, not applicable or unspecified: Secondary | ICD-10-CM

## 2016-11-18 ENCOUNTER — Ambulatory Visit (INDEPENDENT_AMBULATORY_CARE_PROVIDER_SITE_OTHER): Payer: BLUE CROSS/BLUE SHIELD | Admitting: Student

## 2016-11-18 VITALS — BP 130/76 | HR 100 | Wt 144.0 lb

## 2016-11-18 DIAGNOSIS — Z3480 Encounter for supervision of other normal pregnancy, unspecified trimester: Secondary | ICD-10-CM

## 2016-11-18 DIAGNOSIS — O359XX0 Maternal care for (suspected) fetal abnormality and damage, unspecified, not applicable or unspecified: Secondary | ICD-10-CM

## 2016-11-18 DIAGNOSIS — Z3483 Encounter for supervision of other normal pregnancy, third trimester: Secondary | ICD-10-CM

## 2016-11-18 NOTE — Patient Instructions (Signed)
Cesarean Delivery °Cesarean birth, or cesarean delivery, is the surgical delivery of a baby through an incision in the abdomen and the uterus. This may be referred to as a C-section. This procedure may be scheduled ahead of time, or it may be done in an emergency situation. °Tell a health care provider about: °· Any allergies you have. °· All medicines you are taking, including vitamins, herbs, eye drops, creams, and over-the-counter medicines. °· Any problems you or family members have had with anesthetic medicines. °· Any blood disorders you have. °· Any surgeries you have had. °· Any medical conditions you have. °· Whether you or any members of your family have a history of deep vein thrombosis (DVT) or pulmonary embolism (PE). °What are the risks? °Generally, this is a safe procedure. However, problems may occur, including: °· Infection. °· Bleeding. °· Allergic reactions to medicines. °· Damage to other structures or organs. °· Blood clots. °· Injury to your baby. ° °What happens before the procedure? °· Follow instructions from your health care provider about eating or drinking restrictions. °· Follow instructions from your health care provider about bathing before your procedure to help reduce your risk of infection. °· If you know that you are going to have a cesarean delivery, do not shave your pubic area. Shaving before the procedure may increase your risk of infection. °· Ask your health care provider about: °? Changing or stopping your regular medicines. This is especially important if you are taking diabetes medicines or blood thinners. °? Your pain management plan. This is especially important if you plan to breastfeed your baby. °? How long you will be in the hospital after the procedure. °? Any concerns you may have about receiving blood products if you need them during the procedure. °? Cord blood banking, if you plan to collect your baby’s umbilical cord blood. °· You may also want to ask your  health care provider: °? Whether you will be able to hold or breastfeed your baby while you are still in the operating room. °? Whether your baby can stay with you immediately after the procedure and during your recovery. °? Whether a family member or a person of your choice can go with you into the operating room and stay with you during the procedure, immediately after the procedure, and during your recovery. °· Plan to have someone drive you home when you are discharged from the hospital. °What happens during the procedure? °· Fetal monitors will be placed on your abdomen to monitor your heart rate and your baby's heart rate. °· Depending on the reason for your cesarean delivery, you may have a physical exam or additional testing, such as an ultrasound. °· An IV tube will be inserted into one of your veins. °· You may have your blood or urine tested. °· You will be given antibiotic medicine to help prevent infection. °· You may be given a special warming gown to wear to keep your temperature stable. °· Hair may be removed from your pubic area. °· The skin of your pubic area and lower abdomen will be cleaned with a germ-killing solution (antiseptic). °· A catheter may be inserted into your bladder through your urethra. This drains your urine during the procedure. °· You may be given one or more of the following: °? A medicine to numb the area (local anesthetic). °? A medicine to make you fall asleep (general anesthetic). °? A medicine (regional anesthetic) that is injected into your back or through a small   thin tube placed in your back (spinal anesthetic or epidural anesthetic). This numbs everything below the injection site and allows you to stay awake during your procedure. If this makes you feel nauseous, tell your health care provider. Medicines will be available to help reduce any nausea you may feel. °· An incision will be made in your abdomen, and then in your uterus. °· If you are awake during your  procedure, you may feel tugging and pulling in your abdomen, but you should not feel pain. If you feel pain, tell your health care provider immediately. °· Your baby will be removed from your uterus. You may feel more pressure or pushing while this happens. °· Immediately after birth, your baby will be dried and kept warm. You may be able to hold and breastfeed your baby. The umbilical cord may be clamped and cut during this time. °· Your placenta will be removed from your uterus. °· Your incisions will be closed with stitches (sutures). Staples, skin glue, or adhesive strips may also be applied to the incision in your abdomen. °· Bandages (dressings) will be placed over the incision in your abdomen. °The procedure may vary among health care providers and hospitals. °What happens after the procedure? °· Your blood pressure, heart rate, breathing rate, and blood oxygen level will be monitored often until the medicines you were given have worn off. °· You may continue to receive fluids and medicines through an IV tube. °· You will have some pain. Medicines will be available to help control your pain. °· To help prevent blood clots: °? You may be given medicines. °? You may have to wear compression stockings or devices. °? You will be encouraged to walk around when you are able. °· Hospital staff will encourage and support bonding with your baby. Your hospital may allow you and your baby to stay in the same room (rooming in) during your hospital stay to encourage successful breastfeeding. °· You may be encouraged to cough and breathe deeply often. This helps to prevent lung problems. °· If you have a catheter draining your urine, it will be removed as soon as possible after your procedure. °This information is not intended to replace advice given to you by your health care provider. Make sure you discuss any questions you have with your health care provider. °Document Released: 03/23/2005 Document Revised: 08/29/2015  Document Reviewed: 01/01/2015 °Elsevier Interactive Patient Education © 2017 [employer]vier Inc. ° °

## 2016-11-18 NOTE — Progress Notes (Signed)
   PRENATAL VISIT NOTE  Subjective:  Coralie Carpeniffany Rickels is a 26 y.o. G2P1001 at 4594w3d being seen today for ongoing prenatal care.  She is currently monitored for the following issues for this low-risk pregnancy and has Supervision of normal pregnancy, antepartum; Cystic fibrosis carrier; Asymptomatic bacteriuria during pregnancy; Previous cesarean delivery, antepartum; and Fetal abnormality affecting management of mother, antepartum on her problem list.  Patient reports no complaints.  Contractions: Not present. Vag. Bleeding: Scant.  Movement: Present. Denies leaking of fluid.   The following portions of the patient's history were reviewed and updated as appropriate: allergies, current medications, past family history, past medical history, past social history, past surgical history and problem list. Problem list updated.  Objective:   Vitals:   11/18/16 1539  BP: 130/76  Pulse: 100  Weight: 144 lb (65.3 kg)    Fetal Status: Fetal Heart Rate (bpm): 145 Fundal Height: 31 cm Movement: Present     General:  Alert, oriented and cooperative. Patient is in no acute distress.  Skin: Skin is warm and dry. No rash noted.   Cardiovascular: Normal heart rate noted  Respiratory: Normal respiratory effort, no problems with respiration noted  Abdomen: Soft, gravid, appropriate for gestational age.  Pain/Pressure: Absent     Pelvic: Cervical exam deferred        Extremities: Normal range of motion.  Edema: None  Mental Status:  Normal mood and affect. Normal behavior. Normal judgment and thought content.   Assessment and Plan:  Pregnancy: G2P1001 at 2194w3d  1. Supervision of other normal pregnancy, antepartum Feeling well; no complaints.   2. Fetal abnormality affecting management of mother, antepartum, single or unspecified fetus Now resolved on US from 11-13-2016.   Preterm labor symptoms and general obstetric precautions including but not limited to vaginal bleeding, contractions, leaking of  fluid and fetal movement were reviewed in detail with the patient. Please refer to After Visit Summary for other counseling recommendations.  Return in about 2 weeks (around 12/02/2016).   Marylene LandKathryn Lorraine Kooistra, CNM

## 2016-12-02 ENCOUNTER — Ambulatory Visit (INDEPENDENT_AMBULATORY_CARE_PROVIDER_SITE_OTHER): Payer: BLUE CROSS/BLUE SHIELD | Admitting: Student

## 2016-12-02 VITALS — BP 122/76 | HR 99 | Wt 149.0 lb

## 2016-12-02 DIAGNOSIS — R8271 Bacteriuria: Secondary | ICD-10-CM

## 2016-12-02 DIAGNOSIS — O9989 Other specified diseases and conditions complicating pregnancy, childbirth and the puerperium: Secondary | ICD-10-CM

## 2016-12-02 DIAGNOSIS — Z3480 Encounter for supervision of other normal pregnancy, unspecified trimester: Secondary | ICD-10-CM

## 2016-12-02 NOTE — Patient Instructions (Signed)
Cesarean Delivery °Cesarean birth, or cesarean delivery, is the surgical delivery of a baby through an incision in the abdomen and the uterus. This may be referred to as a C-section. This procedure may be scheduled ahead of time, or it may be done in an emergency situation. °Tell a health care provider about: °· Any allergies you have. °· All medicines you are taking, including vitamins, herbs, eye drops, creams, and over-the-counter medicines. °· Any problems you or family members have had with anesthetic medicines. °· Any blood disorders you have. °· Any surgeries you have had. °· Any medical conditions you have. °· Whether you or any members of your family have a history of deep vein thrombosis (DVT) or pulmonary embolism (PE). °What are the risks? °Generally, this is a safe procedure. However, problems may occur, including: °· Infection. °· Bleeding. °· Allergic reactions to medicines. °· Damage to other structures or organs. °· Blood clots. °· Injury to your baby. ° °What happens before the procedure? °· Follow instructions from your health care provider about eating or drinking restrictions. °· Follow instructions from your health care provider about bathing before your procedure to help reduce your risk of infection. °· If you know that you are going to have a cesarean delivery, do not shave your pubic area. Shaving before the procedure may increase your risk of infection. °· Ask your health care provider about: °? Changing or stopping your regular medicines. This is especially important if you are taking diabetes medicines or blood thinners. °? Your pain management plan. This is especially important if you plan to breastfeed your baby. °? How long you will be in the hospital after the procedure. °? Any concerns you may have about receiving blood products if you need them during the procedure. °? Cord blood banking, if you plan to collect your baby’s umbilical cord blood. °· You may also want to ask your  health care provider: °? Whether you will be able to hold or breastfeed your baby while you are still in the operating room. °? Whether your baby can stay with you immediately after the procedure and during your recovery. °? Whether a family member or a person of your choice can go with you into the operating room and stay with you during the procedure, immediately after the procedure, and during your recovery. °· Plan to have someone drive you home when you are discharged from the hospital. °What happens during the procedure? °· Fetal monitors will be placed on your abdomen to monitor your heart rate and your baby's heart rate. °· Depending on the reason for your cesarean delivery, you may have a physical exam or additional testing, such as an ultrasound. °· An IV tube will be inserted into one of your veins. °· You may have your blood or urine tested. °· You will be given antibiotic medicine to help prevent infection. °· You may be given a special warming gown to wear to keep your temperature stable. °· Hair may be removed from your pubic area. °· The skin of your pubic area and lower abdomen will be cleaned with a germ-killing solution (antiseptic). °· A catheter may be inserted into your bladder through your urethra. This drains your urine during the procedure. °· You may be given one or more of the following: °? A medicine to numb the area (local anesthetic). °? A medicine to make you fall asleep (general anesthetic). °? A medicine (regional anesthetic) that is injected into your back or through a small   thin tube placed in your back (spinal anesthetic or epidural anesthetic). This numbs everything below the injection site and allows you to stay awake during your procedure. If this makes you feel nauseous, tell your health care provider. Medicines will be available to help reduce any nausea you may feel. °· An incision will be made in your abdomen, and then in your uterus. °· If you are awake during your  procedure, you may feel tugging and pulling in your abdomen, but you should not feel pain. If you feel pain, tell your health care provider immediately. °· Your baby will be removed from your uterus. You may feel more pressure or pushing while this happens. °· Immediately after birth, your baby will be dried and kept warm. You may be able to hold and breastfeed your baby. The umbilical cord may be clamped and cut during this time. °· Your placenta will be removed from your uterus. °· Your incisions will be closed with stitches (sutures). Staples, skin glue, or adhesive strips may also be applied to the incision in your abdomen. °· Bandages (dressings) will be placed over the incision in your abdomen. °The procedure may vary among health care providers and hospitals. °What happens after the procedure? °· Your blood pressure, heart rate, breathing rate, and blood oxygen level will be monitored often until the medicines you were given have worn off. °· You may continue to receive fluids and medicines through an IV tube. °· You will have some pain. Medicines will be available to help control your pain. °· To help prevent blood clots: °? You may be given medicines. °? You may have to wear compression stockings or devices. °? You will be encouraged to walk around when you are able. °· Hospital staff will encourage and support bonding with your baby. Your hospital may allow you and your baby to stay in the same room (rooming in) during your hospital stay to encourage successful breastfeeding. °· You may be encouraged to cough and breathe deeply often. This helps to prevent lung problems. °· If you have a catheter draining your urine, it will be removed as soon as possible after your procedure. °This information is not intended to replace advice given to you by your health care provider. Make sure you discuss any questions you have with your health care provider. °Document Released: 03/23/2005 Document Revised: 08/29/2015  Document Reviewed: 01/01/2015 °Elsevier Interactive Patient Education © 2017 [employer]vier Inc. ° °

## 2016-12-03 NOTE — Progress Notes (Signed)
   PRENATAL VISIT NOTE  Subjective:  Emily Mack is a 26 y.o. G2P1001 at 318w4d being seen today for ongoing prenatal care.  She is currently monitored for the following issues for this low-risk pregnancy and has Supervision of normal pregnancy, antepartum; Cystic fibrosis carrier; Asymptomatic bacteriuria during pregnancy; Previous cesarean delivery, antepartum; and Fetal abnormality affecting management of mother, antepartum on her problem list.  Patient reports no complaints.  Contractions: Irregular. Vag. Bleeding: Scant.  Movement: Present. Denies leaking of fluid.   The following portions of the patient's history were reviewed and updated as appropriate: allergies, current medications, past family history, past medical history, past social history, past surgical history and problem list. Problem list updated.  Objective:   Vitals:   12/02/16 1554  BP: 122/76  Pulse: 99  Weight: 149 lb (67.6 kg)    Fetal Status: Fetal Heart Rate (bpm): 140 Fundal Height: 32 cm Movement: Present     General:  Alert, oriented and cooperative. Patient is in no acute distress.  Skin: Skin is warm and dry. No rash noted.   Cardiovascular: Normal heart rate noted  Respiratory: Normal respiratory effort, no problems with respiration noted  Abdomen: Soft, gravid, appropriate for gestational age.  Pain/Pressure: Absent     Pelvic: Cervical exam deferred        Extremities: Normal range of motion.  Edema: None  Mental Status:  Normal mood and affect. Normal behavior. Normal judgment and thought content.   Assessment and Plan:  Pregnancy: G2P1001 at 178w4d  1. Asymptomatic bacteriuria during pregnancy -Negative for s/s today  2. Supervision of other normal pregnancy, antepartum Feeling well; no complaints.   Preterm labor symptoms and general obstetric precautions including but not limited to vaginal bleeding, contractions, leaking of fluid and fetal movement were reviewed in detail with the  patient. Please refer to After Visit Summary for other counseling recommendations.  Return in about 2 weeks (around 12/16/2016).   Marylene LandKathryn Lorraine Duanne Duchesne, CNM

## 2016-12-16 ENCOUNTER — Ambulatory Visit (INDEPENDENT_AMBULATORY_CARE_PROVIDER_SITE_OTHER): Payer: BLUE CROSS/BLUE SHIELD | Admitting: Student

## 2016-12-16 DIAGNOSIS — Z3483 Encounter for supervision of other normal pregnancy, third trimester: Secondary | ICD-10-CM

## 2016-12-16 DIAGNOSIS — Z22322 Carrier or suspected carrier of Methicillin resistant Staphylococcus aureus: Secondary | ICD-10-CM

## 2016-12-16 DIAGNOSIS — Z3480 Encounter for supervision of other normal pregnancy, unspecified trimester: Secondary | ICD-10-CM

## 2016-12-16 NOTE — Patient Instructions (Signed)

## 2016-12-16 NOTE — Progress Notes (Signed)
   PRENATAL VISIT NOTE  Subjective:  Emily Mack is a 26 y.o. G2P1001 at 9753w3d being seen today for ongoing prenatal care.  She is currently monitored for the following issues for this low-risk pregnancy and has Supervision of normal pregnancy, antepartum; Cystic fibrosis carrier; Asymptomatic bacteriuria during pregnancy; Previous cesarean delivery, antepartum; Fetal abnormality affecting management of mother, antepartum; and MRSA carrier on her problem list.  Patient reports no complaints.  Contractions: Irregular. Vag. Bleeding: None.  Movement: Present. Denies leaking of fluid.   The following portions of the patient's history were reviewed and updated as appropriate: allergies, current medications, past family history, past medical history, past social history, past surgical history and problem list. Problem list updated.  Objective:   Vitals:   12/16/16 1615  BP: 127/77  Pulse: 88  Weight: 151 lb 3.2 oz (68.6 kg)    Fetal Status: Fetal Heart Rate (bpm): 140 Fundal Height: 35 cm Movement: Present     General:  Alert, oriented and cooperative. Patient is in no acute distress.  Skin: Skin is warm and dry. No rash noted.   Cardiovascular: Normal heart rate noted  Respiratory: Normal respiratory effort, no problems with respiration noted  Abdomen: Soft, gravid, appropriate for gestational age.  Pain/Pressure: Present     Pelvic: Cervical exam deferred        Extremities: Normal range of motion.  Edema: None  Mental Status:  Normal mood and affect. Normal behavior. Normal judgment and thought content.   Assessment and Plan:  Pregnancy: G2P1001 at 6053w3d  1. MRSA carrier   2. Supervision of other normal pregnancy, antepartum Patient doing well, no complaints. Will do GBS/GC CT next visit, as well as determine presentation.   Preterm labor symptoms and general obstetric precautions including but not limited to vaginal bleeding, contractions, leaking of fluid and fetal  movement were reviewed in detail with the patient. Please refer to After Visit Summary for other counseling recommendations.  Return in about 2 weeks (around 12/30/2016).   Emily Mack, CNM

## 2016-12-30 ENCOUNTER — Ambulatory Visit (INDEPENDENT_AMBULATORY_CARE_PROVIDER_SITE_OTHER): Payer: BLUE CROSS/BLUE SHIELD | Admitting: Obstetrics and Gynecology

## 2016-12-30 VITALS — BP 111/80 | HR 92 | Wt 151.0 lb

## 2016-12-30 DIAGNOSIS — Z3480 Encounter for supervision of other normal pregnancy, unspecified trimester: Secondary | ICD-10-CM | POA: Diagnosis not present

## 2016-12-30 DIAGNOSIS — Z113 Encounter for screening for infections with a predominantly sexual mode of transmission: Secondary | ICD-10-CM | POA: Diagnosis not present

## 2016-12-30 DIAGNOSIS — Z3483 Encounter for supervision of other normal pregnancy, third trimester: Secondary | ICD-10-CM

## 2016-12-30 DIAGNOSIS — O34219 Maternal care for unspecified type scar from previous cesarean delivery: Secondary | ICD-10-CM

## 2016-12-30 NOTE — Progress Notes (Signed)
   PRENATAL VISIT NOTE  Subjective:  Emily Mack is a 26 y.o. G2P1001 at [redacted]w[redacted]d being seen today for ongoing prenatal care.  She is currently monitored for the following issues for this low-risk pregnancy and has Supervision of normal pregnancy, antepartum; Cystic fibrosis carrier; Asymptomatic bacteriuria during pregnancy; Previous cesarean delivery, antepartum; Fetal abnormality affecting management of mother, antepartum; and MRSA carrier on her problem list.  Patient reports no complaints.  Contractions: Irregular. Vag. Bleeding: None.  Movement: Present. Denies leaking of fluid.   The following portions of the patient's history were reviewed and updated as appropriate: allergies, current medications, past family history, past medical history, past social history, past surgical history and problem list. Problem list updated.  Objective:   Vitals:   12/30/16 1610  BP: 111/80  Pulse: 92  Weight: 151 lb (68.5 kg)    Fetal Status: Fetal Heart Rate (bpm): 135 Fundal Height: 37 cm Movement: Present  Presentation: Vertex  General:  Alert, oriented and cooperative. Patient is in no acute distress.  Skin: Skin is warm and dry. No rash noted.   Cardiovascular: Normal heart rate noted  Respiratory: Normal respiratory effort, no problems with respiration noted  Abdomen: Soft, gravid, appropriate for gestational age.  Pain/Pressure: Present     Pelvic: Cervical exam performed Dilation: Closed Effacement (%): Thick Station: Ballotable  Extremities: Normal range of motion.  Edema: None  Mental Status:  Normal mood and affect. Normal behavior. Normal judgment and thought content.   Assessment and Plan:  Pregnancy: G2P1001 at [redacted]w[redacted]d  1. Supervision of other normal pregnancy, antepartum Patient is doing well without complaints Cultures today - Strep Gp B NAA - GC/Chlamydia probe amp ([place] [facility])not at [facility]  2. Previous cesarean delivery, antepartum Scheduled for repeat at 39  weeks Patient undecided on contraception  Preterm labor symptoms and general obstetric precautions including but not limited to vaginal bleeding, contractions, leaking of fluid and fetal movement were reviewed in detail with the patient. Please refer to After Visit Summary for other counseling recommendations.  No Follow-up on file.   Catalina Antigua, MD

## 2017-01-01 LAB — STREP GP B NAA: STREP GROUP B AG: NEGATIVE

## 2017-01-01 LAB — GC/CHLAMYDIA PROBE AMP (~~LOC~~) NOT AT ARMC
Chlamydia: NEGATIVE
Neisseria Gonorrhea: NEGATIVE

## 2017-01-05 ENCOUNTER — Telehealth (HOSPITAL_COMMUNITY): Payer: Self-pay | Admitting: *Deleted

## 2017-01-05 NOTE — Telephone Encounter (Signed)
Preadmission message

## 2017-01-06 ENCOUNTER — Ambulatory Visit (INDEPENDENT_AMBULATORY_CARE_PROVIDER_SITE_OTHER): Payer: BLUE CROSS/BLUE SHIELD | Admitting: Family Medicine

## 2017-01-06 VITALS — BP 124/84 | HR 120 | Wt 154.6 lb

## 2017-01-06 DIAGNOSIS — O34219 Maternal care for unspecified type scar from previous cesarean delivery: Secondary | ICD-10-CM

## 2017-01-06 DIAGNOSIS — O9989 Other specified diseases and conditions complicating pregnancy, childbirth and the puerperium: Secondary | ICD-10-CM

## 2017-01-06 DIAGNOSIS — Z141 Cystic fibrosis carrier: Secondary | ICD-10-CM

## 2017-01-06 DIAGNOSIS — O99891 Other specified diseases and conditions complicating pregnancy: Secondary | ICD-10-CM

## 2017-01-06 DIAGNOSIS — O359XX0 Maternal care for (suspected) fetal abnormality and damage, unspecified, not applicable or unspecified: Secondary | ICD-10-CM

## 2017-01-06 DIAGNOSIS — R8271 Bacteriuria: Secondary | ICD-10-CM

## 2017-01-06 DIAGNOSIS — Z3483 Encounter for supervision of other normal pregnancy, third trimester: Secondary | ICD-10-CM

## 2017-01-06 DIAGNOSIS — Z3480 Encounter for supervision of other normal pregnancy, unspecified trimester: Secondary | ICD-10-CM

## 2017-01-06 NOTE — Patient Instructions (Signed)
Breastfeeding Deciding to breastfeed is one of the best choices you can make for you and your baby. A change in hormones during pregnancy causes your breast tissue to grow and increases the number and size of your milk ducts. These hormones also allow proteins, sugars, and fats from your blood supply to make breast milk in your milk-producing glands. Hormones prevent breast milk from being released before your baby is born as well as prompt milk flow after birth. Once breastfeeding has begun, thoughts of your baby, as well as his or her sucking or crying, can stimulate the release of milk from your milk-producing glands. Benefits of breastfeeding For Your Baby  Your first milk (colostrum) helps your baby's digestive system function better.  There are antibodies in your milk that help your baby fight off infections.  Your baby has a lower incidence of asthma, allergies, and sudden infant death syndrome.  The nutrients in breast milk are better for your baby than infant formulas and are designed uniquely for your baby's needs.  Breast milk improves your baby's brain development.  Your baby is less likely to develop other conditions, such as childhood obesity, asthma, or type 2 diabetes mellitus.  For You  Breastfeeding helps to create a very special bond between you and your baby.  Breastfeeding is convenient. Breast milk is always available at the correct temperature and costs nothing.  Breastfeeding helps to burn calories and helps you lose the weight gained during pregnancy.  Breastfeeding makes your uterus contract to its prepregnancy size faster and slows bleeding (lochia) after you give birth.  Breastfeeding helps to lower your risk of developing type 2 diabetes mellitus, osteoporosis, and breast or ovarian cancer later in life.  Signs that your baby is hungry Early Signs of Hunger  Increased alertness or activity.  Stretching.  Movement of the head from side to  side.  Movement of the head and opening of the mouth when the corner of the mouth or cheek is stroked (rooting).  Increased sucking sounds, smacking lips, cooing, sighing, or squeaking.  Hand-to-mouth movements.  Increased sucking of fingers or hands.  Late Signs of Hunger  Fussing.  Intermittent crying.  Extreme Signs of Hunger Signs of extreme hunger will require calming and consoling before your baby will be able to breastfeed successfully. Do not wait for the following signs of extreme hunger to occur before you initiate breastfeeding:  Restlessness.  A loud, strong cry.  Screaming.  Breastfeeding basics Breastfeeding Initiation  Find a comfortable place to sit or lie down, with your neck and back well supported.  Place a pillow or rolled up blanket under your baby to bring him or her to the level of your breast (if you are seated). Nursing pillows are specially designed to help support your arms and your baby while you breastfeed.  Make sure that your baby's abdomen is facing your abdomen.  Gently massage your breast. With your fingertips, massage from your chest wall toward your nipple in a circular motion. This encourages milk flow. You may need to continue this action during the feeding if your milk flows slowly.  Support your breast with 4 fingers underneath and your thumb above your nipple. Make sure your fingers are well away from your nipple and your baby's mouth.  Stroke your baby's lips gently with your finger or nipple.  When your baby's mouth is open wide enough, quickly bring your baby to your breast, placing your entire nipple and as much of the colored area   around your nipple (areola) as possible into your baby's mouth. ? More areola should be visible above your baby's upper lip than below the lower lip. ? Your baby's tongue should be between his or her lower gum and your breast.  Ensure that your baby's mouth is correctly positioned around your nipple  (latched). Your baby's lips should create a seal on your breast and be turned out (everted).  It is common for your baby to suck about 2-3 minutes in order to start the flow of breast milk.  Latching Teaching your baby how to latch on to your breast properly is very important. An improper latch can cause nipple pain and decreased milk supply for you and poor weight gain in your baby. Also, if your baby is not latched onto your nipple properly, he or she may swallow some air during feeding. This can make your baby fussy. Burping your baby when you switch breasts during the feeding can help to get rid of the air. However, teaching your baby to latch on properly is still the best way to prevent fussiness from swallowing air while breastfeeding. Signs that your baby has successfully latched on to your nipple:  Silent tugging or silent sucking, without causing you pain.  Swallowing heard between every 3-4 sucks.  Muscle movement above and in front of his or her ears while sucking.  Signs that your baby has not successfully latched on to nipple:  Sucking sounds or smacking sounds from your baby while breastfeeding.  Nipple pain.  If you think your baby has not latched on correctly, slip your finger into the corner of your baby's mouth to break the suction and place it between your baby's gums. Attempt breastfeeding initiation again. Signs of Successful Breastfeeding Signs from your baby:  A gradual decrease in the number of sucks or complete cessation of sucking.  Falling asleep.  Relaxation of his or her body.  Retention of a small amount of milk in his or her mouth.  Letting go of your breast by himself or herself.  Signs from you:  Breasts that have increased in firmness, weight, and size 1-3 hours after feeding.  Breasts that are softer immediately after breastfeeding.  Increased milk volume, as well as a change in milk consistency and color by the fifth day of  breastfeeding.  Nipples that are not sore, cracked, or bleeding.  Signs That Your Baby is Getting Enough Milk  Wetting at least 1-2 diapers during the first 24 hours after birth.  Wetting at least 5-6 diapers every 24 hours for the first week after birth. The urine should be clear or pale yellow by 5 days after birth.  Wetting 6-8 diapers every 24 hours as your baby continues to grow and develop.  At least 3 stools in a 24-hour period by age 5 days. The stool should be soft and yellow.  At least 3 stools in a 24-hour period by age 7 days. The stool should be seedy and yellow.  No loss of weight greater than 10% of birth weight during the first 3 days of age.  Average weight gain of 4-7 ounces (113-198 g) per week after age 4 days.  Consistent daily weight gain by age 5 days, without weight loss after the age of 2 weeks.  After a feeding, your baby may spit up a small amount. This is common. Breastfeeding frequency and duration Frequent feeding will help you make more milk and can prevent sore nipples and breast engorgement. Breastfeed when   you feel the need to reduce the fullness of your breasts or when your baby shows signs of hunger. This is called "breastfeeding on demand." Avoid introducing a pacifier to your baby while you are working to establish breastfeeding (the first 4-6 weeks after your baby is born). After this time you may choose to use a pacifier. Research has shown that pacifier use during the first year of a baby's life decreases the risk of sudden infant death syndrome (SIDS). Allow your baby to feed on each breast as long as he or she wants. Breastfeed until your baby is finished feeding. When your baby unlatches or falls asleep while feeding from the first breast, offer the second breast. Because newborns are often sleepy in the first few weeks of life, you may need to awaken your baby to get him or her to feed. Breastfeeding times will vary from baby to baby. However,  the following rules can serve as a guide to help you ensure that your baby is properly fed:  Newborns (babies 4 weeks of age or younger) may breastfeed every 1-3 hours.  Newborns should not go longer than 3 hours during the day or 5 hours during the night without breastfeeding.  You should breastfeed your baby a minimum of 8 times in a 24-hour period until you begin to introduce solid foods to your baby at around 6 months of age.  Breast milk pumping Pumping and storing breast milk allows you to ensure that your baby is exclusively fed your breast milk, even at times when you are unable to breastfeed. This is especially important if you are going back to work while you are still breastfeeding or when you are not able to be present during feedings. Your lactation consultant can give you guidelines on how long it is safe to store breast milk. A breast pump is a machine that allows you to pump milk from your breast into a sterile bottle. The pumped breast milk can then be stored in a refrigerator or freezer. Some breast pumps are operated by hand, while others use electricity. Ask your lactation consultant which type will work best for you. Breast pumps can be purchased, but some hospitals and breastfeeding support groups lease breast pumps on a monthly basis. A lactation consultant can teach you how to hand express breast milk, if you prefer not to use a pump. Caring for your breasts while you breastfeed Nipples can become dry, cracked, and sore while breastfeeding. The following recommendations can help keep your breasts moisturized and healthy:  Avoid using soap on your nipples.  Wear a supportive bra. Although not required, special nursing bras and tank tops are designed to allow access to your breasts for breastfeeding without taking off your entire bra or top. Avoid wearing underwire-style bras or extremely tight bras.  Air dry your nipples for 3-4minutes after each feeding.  Use only cotton  bra pads to absorb leaked breast milk. Leaking of breast milk between feedings is normal.  Use lanolin on your nipples after breastfeeding. Lanolin helps to maintain your skin's normal moisture barrier. If you use pure lanolin, you do not need to wash it off before feeding your baby again. Pure lanolin is not toxic to your baby. You may also hand express a few drops of breast milk and gently massage that milk into your nipples and allow the milk to air dry.  In the first few weeks after giving birth, some women experience extremely full breasts (engorgement). Engorgement can make your   breasts feel heavy, warm, and tender to the touch. Engorgement peaks within 3-5 days after you give birth. The following recommendations can help ease engorgement:  Completely empty your breasts while breastfeeding or pumping. You may want to start by applying warm, moist heat (in the shower or with warm water-soaked hand towels) just before feeding or pumping. This increases circulation and helps the milk flow. If your baby does not completely empty your breasts while breastfeeding, pump any extra milk after he or she is finished.  Wear a snug bra (nursing or regular) or tank top for 1-2 days to signal your body to slightly decrease milk production.  Apply ice packs to your breasts, unless this is too uncomfortable for you.  Make sure that your baby is latched on and positioned properly while breastfeeding.  If engorgement persists after 48 hours of following these recommendations, contact your health care provider or a lactation consultant. Overall health care recommendations while breastfeeding  Eat healthy foods. Alternate between meals and snacks, eating 3 of each per day. Because what you eat affects your breast milk, some of the foods may make your baby more irritable than usual. Avoid eating these foods if you are sure that they are negatively affecting your baby.  Drink milk, fruit juice, and water to  satisfy your thirst (about 10 glasses a day).  Rest often, relax, and continue to take your prenatal vitamins to prevent fatigue, stress, and anemia.  Continue breast self-awareness checks.  Avoid chewing and smoking tobacco. Chemicals from cigarettes that pass into breast milk and exposure to secondhand smoke may harm your baby.  Avoid alcohol and drug use, including marijuana. Some medicines that may be harmful to your baby can pass through breast milk. It is important to ask your health care provider before taking any medicine, including all over-the-counter and prescription medicine as well as vitamin and herbal supplements. It is possible to become pregnant while breastfeeding. If birth control is desired, ask your health care provider about options that will be safe for your baby. Contact a health care provider if:  You feel like you want to stop breastfeeding or have become frustrated with breastfeeding.  You have painful breasts or nipples.  Your nipples are cracked or bleeding.  Your breasts are red, tender, or warm.  You have a swollen area on either breast.  You have a fever or chills.  You have nausea or vomiting.  You have drainage other than breast milk from your nipples.  Your breasts do not become full before feedings by the fifth day after you give birth.  You feel sad and depressed.  Your baby is too sleepy to eat well.  Your baby is having trouble sleeping.  Your baby is wetting less than 3 diapers in a 24-hour period.  Your baby has less than 3 stools in a 24-hour period.  Your baby's skin or the white part of his or her eyes becomes yellow.  Your baby is not gaining weight by 5 days of age. Get help right away if:  Your baby is overly tired (lethargic) and does not want to wake up and feed.  Your baby develops an unexplained fever. This information is not intended to replace advice given to you by your health care provider. Make sure you discuss  any questions you have with your health care provider. Document Released: 03/23/2005 Document Revised: 09/04/2015 Document Reviewed: 09/14/2012 [employer] Interactive Patient Education  2017 [employer]  

## 2017-01-06 NOTE — Progress Notes (Signed)
   PRENATAL VISIT NOTE  Subjective:  Emily Mack is a 26 y.o. G2P1001 at [redacted]w[redacted]d being seen today for ongoing prenatal care.  She is currently monitored for the following issues for this high-risk pregnancy and has Supervision of normal pregnancy, antepartum; Cystic fibrosis carrier; Asymptomatic bacteriuria during pregnancy; Previous cesarean delivery, antepartum; Fetal abnormality affecting management of mother, antepartum; and MRSA carrier on her problem list.  Patient reports no complaints.  Contractions: Irregular.  .  Movement: Present. Denies leaking of fluid.   The following portions of the patient's history were reviewed and updated as appropriate: allergies, current medications, past family history, past medical history, past social history, past surgical history and problem list. Problem list updated.  Objective:   Vitals:   01/06/17 1512  BP: 124/84  Pulse: (!) 120  Weight: 154 lb 9.6 oz (70.1 kg)    Fetal Status: Fetal Heart Rate (bpm): 135   Movement: Present     General:  Alert, oriented and cooperative. Patient is in no acute distress.  Skin: Skin is warm and dry. No rash noted.   Cardiovascular: Normal heart rate noted  Respiratory: Normal respiratory effort, no problems with respiration noted  Abdomen: Soft, gravid, appropriate for gestational age.  Pain/Pressure: Present     Pelvic: Cervical exam performed        Extremities: Normal range of motion.  Edema: None  Mental Status:  Normal mood and affect. Normal behavior. Normal judgment and thought content.   Assessment and Plan:  Pregnancy: G2P1001 at [redacted]w[redacted]d  1. Supervision of other normal pregnancy, antepartum UTD Discussed desire for circ and recommended contacting plan to assure coverage  2. Cystic fibrosis carrier  3. Asymptomatic bacteriuria during pregnancy  4. Previous cesarean delivery, antepartum Desires elective repeat, scheduled for 10/14  5. Fetal abnormality affecting management of mother,  antepartum, single or unspecified fetus UTD that hs now resolved  Term labor symptoms and general obstetric precautions including but not limited to vaginal bleeding, contractions, leaking of fluid and fetal movement were reviewed in detail with the patient. Please refer to After Visit Summary for other counseling recommendations.  Return in about 1 week (around 01/13/2017) for Routine prenatal care.   Federico Flake, MD

## 2017-01-07 ENCOUNTER — Telehealth (HOSPITAL_COMMUNITY): Payer: Self-pay | Admitting: *Deleted

## 2017-01-07 NOTE — Telephone Encounter (Signed)
Preadmission screen  

## 2017-01-08 ENCOUNTER — Telehealth (HOSPITAL_COMMUNITY): Payer: Self-pay | Admitting: *Deleted

## 2017-01-08 NOTE — Telephone Encounter (Signed)
Preadmission screen  

## 2017-01-11 ENCOUNTER — Encounter (HOSPITAL_COMMUNITY): Payer: Self-pay

## 2017-01-11 ENCOUNTER — Telehealth (HOSPITAL_COMMUNITY): Payer: Self-pay | Admitting: *Deleted

## 2017-01-11 NOTE — Telephone Encounter (Signed)
Preadmission screen  

## 2017-01-13 ENCOUNTER — Encounter: Payer: BLUE CROSS/BLUE SHIELD | Admitting: Student

## 2017-01-13 ENCOUNTER — Ambulatory Visit (INDEPENDENT_AMBULATORY_CARE_PROVIDER_SITE_OTHER): Payer: BLUE CROSS/BLUE SHIELD | Admitting: Student

## 2017-01-13 VITALS — BP 117/81 | HR 91 | Wt 155.0 lb

## 2017-01-13 DIAGNOSIS — O34219 Maternal care for unspecified type scar from previous cesarean delivery: Secondary | ICD-10-CM

## 2017-01-13 DIAGNOSIS — Z3480 Encounter for supervision of other normal pregnancy, unspecified trimester: Secondary | ICD-10-CM

## 2017-01-13 DIAGNOSIS — Z3483 Encounter for supervision of other normal pregnancy, third trimester: Secondary | ICD-10-CM

## 2017-01-14 NOTE — Progress Notes (Signed)
   PRENATAL VISIT NOTE  Subjective:  Emily Mack is a 26 y.o. G2P1001 at [redacted]w[redacted]d being seen today for ongoing prenatal care.  She is currently monitored for the following issues for this low-risk pregnancy and has Supervision of normal pregnancy, antepartum; Cystic fibrosis carrier; Asymptomatic bacteriuria during pregnancy; Previous cesarean delivery, antepartum; Fetal abnormality affecting management of mother, antepartum; and MRSA carrier on her problem list.  Patient reports no complaints.  Contractions: Irregular.  .  Movement: Present. Denies leaking of fluid.   The following portions of the patient's history were reviewed and updated as appropriate: allergies, current medications, past family history, past medical history, past social history, past surgical history and problem list. Problem list updated.  Objective:   Vitals:   01/13/17 1511  BP: 117/81  Pulse: 91  Weight: 155 lb (70.3 kg)    Fetal Status: Fetal Heart Rate (bpm): 145 Fundal Height: 38 cm Movement: Present     General:  Alert, oriented and cooperative. Patient is in no acute distress.  Skin: Skin is warm and dry. No rash noted.   Cardiovascular: Normal heart rate noted  Respiratory: Normal respiratory effort, no problems with respiration noted  Abdomen: Soft, gravid, appropriate for gestational age.  Pain/Pressure: Present     Pelvic: Cervical exam performed        Extremities: Normal range of motion.  Edema: None  Mental Status:  Normal mood and affect. Normal behavior. Normal judgment and thought content.   Assessment and Plan:  Pregnancy: G2P1001 at [redacted]w[redacted]d  1. Supervision of other normal pregnancy, antepartum Doing well, no complaints.   2. Previous cesarean delivery, antepartum C/section scheduled for 10/14  Term labor symptoms and general obstetric precautions including but not limited to vaginal bleeding, contractions, leaking of fluid and fetal movement were reviewed in detail with the  patient. Please refer to After Visit Summary for other counseling recommendations.  Return if symptoms worsen or fail to improve.   Marylene Land, CNM

## 2017-01-15 ENCOUNTER — Encounter (HOSPITAL_COMMUNITY)
Admission: RE | Admit: 2017-01-15 | Discharge: 2017-01-15 | Disposition: A | Discharge status: Home / Self Care | Payer: BLUE CROSS/BLUE SHIELD | Source: Ambulatory Visit | Attending: Obstetrics & Gynecology | Admitting: Obstetrics & Gynecology

## 2017-01-15 DIAGNOSIS — Q544 Congenital chordee: Secondary | ICD-10-CM | POA: Diagnosis not present

## 2017-01-15 DIAGNOSIS — Z141 Cystic fibrosis carrier: Secondary | ICD-10-CM | POA: Diagnosis not present

## 2017-01-15 DIAGNOSIS — D649 Anemia, unspecified: Secondary | ICD-10-CM | POA: Diagnosis not present

## 2017-01-15 DIAGNOSIS — O9902 Anemia complicating childbirth: Secondary | ICD-10-CM | POA: Diagnosis not present

## 2017-01-15 DIAGNOSIS — O34211 Maternal care for low transverse scar from previous cesarean delivery: Secondary | ICD-10-CM | POA: Diagnosis not present

## 2017-01-15 DIAGNOSIS — O34219 Maternal care for unspecified type scar from previous cesarean delivery: Secondary | ICD-10-CM | POA: Diagnosis not present

## 2017-01-15 DIAGNOSIS — D239 Other benign neoplasm of skin, unspecified: Secondary | ICD-10-CM | POA: Diagnosis not present

## 2017-01-15 DIAGNOSIS — Z23 Encounter for immunization: Secondary | ICD-10-CM | POA: Diagnosis not present

## 2017-01-15 DIAGNOSIS — Z3A00 Weeks of gestation of pregnancy not specified: Secondary | ICD-10-CM | POA: Diagnosis not present

## 2017-01-15 DIAGNOSIS — Z3A39 39 weeks gestation of pregnancy: Secondary | ICD-10-CM | POA: Diagnosis not present

## 2017-01-15 LAB — ABO/RH: ABO/RH(D): A POS

## 2017-01-15 LAB — CBC
HCT: 35.1 % — ABNORMAL LOW (ref 36.0–46.0)
Hemoglobin: 11.9 g/dL — ABNORMAL LOW (ref 12.0–15.0)
MCH: 29.1 pg (ref 26.0–34.0)
MCHC: 33.9 g/dL (ref 30.0–36.0)
MCV: 85.8 fL (ref 78.0–100.0)
PLATELETS: 283 10*3/uL (ref 150–400)
RBC: 4.09 MIL/uL (ref 3.87–5.11)
RDW: 14.1 % (ref 11.5–15.5)
WBC: 10.2 10*3/uL (ref 4.0–10.5)

## 2017-01-15 LAB — TYPE AND SCREEN
ABO/RH(D): A POS
Antibody Screen: NEGATIVE

## 2017-01-15 NOTE — Patient Instructions (Signed)
Emily Mack  01/15/2017   Your procedure is scheduled on:  01/17/2017  Enter through the Main Entrance of [facility] at 0545 AM.  Pick up the phone at the desk and dial [phone]  Call this number if you have problems the morning of surgery:[phone]  Remember:   Do not eat food:After Midnight.  Do not drink clear liquids: After Midnight.  Take these medicines the morning of surgery with A SIP OF WATER: none   Do not wear jewelry, make-up or nail polish.  Do not wear lotions, powders, or perfumes. Do not wear deodorant.  Do not shave 48 hours prior to surgery.  Do not bring valuables to the hospital.  [facility] is not   responsible for any belongings or valuables brought to the hospital.  Contacts, dentures or bridgework may not be worn into surgery.  Leave suitcase in the car. After surgery it may be brought to your room.  For patients admitted to the hospital, checkout time is 11:00 AM the day of              discharge.    N/A   Please read over the following fact sheets that you were given:   Surgical Site Infection Prevention

## 2017-01-16 LAB — RPR: RPR Ser Ql: NONREACTIVE

## 2017-01-17 ENCOUNTER — Encounter (HOSPITAL_COMMUNITY): Payer: Self-pay | Admitting: General Practice

## 2017-01-17 ENCOUNTER — Inpatient Hospital Stay (HOSPITAL_COMMUNITY)
Admission: AD | Admit: 2017-01-17 | Discharge: 2017-01-19 | DRG: 788 | Disposition: A | Discharge status: Home / Self Care | Payer: BLUE CROSS/BLUE SHIELD | Source: Ambulatory Visit | Attending: Obstetrics & Gynecology | Admitting: Obstetrics & Gynecology

## 2017-01-17 ENCOUNTER — Inpatient Hospital Stay (HOSPITAL_COMMUNITY): Payer: BLUE CROSS/BLUE SHIELD | Admitting: Anesthesiology

## 2017-01-17 ENCOUNTER — Encounter (HOSPITAL_COMMUNITY)
Admission: AD | Disposition: A | Discharge status: Home / Self Care | Payer: Self-pay | Source: Ambulatory Visit | Attending: Obstetrics & Gynecology

## 2017-01-17 DIAGNOSIS — D239 Other benign neoplasm of skin, unspecified: Secondary | ICD-10-CM | POA: Diagnosis not present

## 2017-01-17 DIAGNOSIS — O9902 Anemia complicating childbirth: Secondary | ICD-10-CM | POA: Diagnosis present

## 2017-01-17 DIAGNOSIS — Z9889 Other specified postprocedural states: Secondary | ICD-10-CM

## 2017-01-17 DIAGNOSIS — O34219 Maternal care for unspecified type scar from previous cesarean delivery: Secondary | ICD-10-CM | POA: Diagnosis not present

## 2017-01-17 DIAGNOSIS — Z141 Cystic fibrosis carrier: Secondary | ICD-10-CM

## 2017-01-17 DIAGNOSIS — Z98891 History of uterine scar from previous surgery: Secondary | ICD-10-CM | POA: Diagnosis present

## 2017-01-17 DIAGNOSIS — O34211 Maternal care for low transverse scar from previous cesarean delivery: Secondary | ICD-10-CM | POA: Diagnosis not present

## 2017-01-17 DIAGNOSIS — Z3A39 39 weeks gestation of pregnancy: Secondary | ICD-10-CM

## 2017-01-17 DIAGNOSIS — Z3A00 Weeks of gestation of pregnancy not specified: Secondary | ICD-10-CM | POA: Diagnosis not present

## 2017-01-17 DIAGNOSIS — D649 Anemia, unspecified: Secondary | ICD-10-CM | POA: Diagnosis present

## 2017-01-17 SURGERY — Surgical Case
Anesthesia: Spinal

## 2017-01-17 MED ORDER — OXYTOCIN 10 UNIT/ML IJ SOLN
INTRAMUSCULAR | Status: AC
  Filled 2017-01-17: qty 4

## 2017-01-17 MED ORDER — BUPIVACAINE HCL (PF) 0.5 % IJ SOLN
INTRAMUSCULAR | Status: DC | PRN
  Administered 2017-01-17: 30 mL

## 2017-01-17 MED ORDER — LACTATED RINGERS IV SOLN
INTRAVENOUS | Status: DC | PRN
  Administered 2017-01-17: 08:00:00 via INTRAVENOUS

## 2017-01-17 MED ORDER — NALBUPHINE HCL 10 MG/ML IJ SOLN
5.0000 mg | INTRAMUSCULAR | Status: DC | PRN
Start: 2017-01-17 — End: 2017-01-18

## 2017-01-17 MED ORDER — SIMETHICONE 80 MG PO CHEW: 80.0000 mg | CHEWABLE_TABLET | ORAL | Status: DC | PRN

## 2017-01-17 MED ORDER — SCOPOLAMINE 1 MG/3DAYS TD PT72
MEDICATED_PATCH | TRANSDERMAL | Status: AC
  Filled 2017-01-17: qty 1

## 2017-01-17 MED ORDER — MORPHINE SULFATE (PF) 0.5 MG/ML IJ SOLN
INTRAMUSCULAR | Status: DC | PRN
  Administered 2017-01-17: .2 mg via INTRATHECAL

## 2017-01-17 MED ORDER — KETOROLAC TROMETHAMINE 30 MG/ML IJ SOLN
INTRAMUSCULAR | Status: AC
  Filled 2017-01-17: qty 1

## 2017-01-17 MED ORDER — FAMOTIDINE 20 MG PO TABS
20.0000 mg | ORAL_TABLET | Freq: Once | ORAL | Status: AC
  Administered 2017-01-17: 20 mg via ORAL
  Filled 2017-01-17: qty 1

## 2017-01-17 MED ORDER — WITCH HAZEL-GLYCERIN EX PADS: 1.0000 "application " | MEDICATED_PAD | CUTANEOUS | Status: DC | PRN

## 2017-01-17 MED ORDER — PHENYLEPHRINE 8 MG IN D5W 100 ML (0.08MG/ML) PREMIX OPTIME
INJECTION | INTRAVENOUS | Status: DC | PRN
  Administered 2017-01-17: 60 ug/min via INTRAVENOUS

## 2017-01-17 MED ORDER — SENNOSIDES-DOCUSATE SODIUM 8.6-50 MG PO TABS
2.0000 | ORAL_TABLET | ORAL | Status: DC
  Administered 2017-01-18 – 2017-01-19 (×2): 2 via ORAL
  Filled 2017-01-17 (×2): qty 2

## 2017-01-17 MED ORDER — TETANUS-DIPHTH-ACELL PERTUSSIS 5-2.5-18.5 LF-MCG/0.5 IM SUSP: 0.5000 mL | Freq: Once | INTRAMUSCULAR | Status: DC

## 2017-01-17 MED ORDER — LACTATED RINGERS IV SOLN
INTRAVENOUS | Status: DC | PRN
  Administered 2017-01-17 (×2): via INTRAVENOUS

## 2017-01-17 MED ORDER — PRENATAL MULTIVITAMIN CH
1.0000 | ORAL_TABLET | Freq: Every day | ORAL | Status: DC
  Administered 2017-01-17 – 2017-01-19 (×3): 1 via ORAL
  Filled 2017-01-17 (×4): qty 1

## 2017-01-17 MED ORDER — OXYTOCIN 10 UNIT/ML IJ SOLN
INTRAVENOUS | Status: DC | PRN
  Administered 2017-01-17: 40 [IU] via INTRAVENOUS

## 2017-01-17 MED ORDER — DEXAMETHASONE SODIUM PHOSPHATE 10 MG/ML IJ SOLN
INTRAMUSCULAR | Status: AC
  Filled 2017-01-17: qty 1

## 2017-01-17 MED ORDER — DIPHENHYDRAMINE HCL 50 MG/ML IJ SOLN
12.5000 mg | INTRAMUSCULAR | Status: DC | PRN
  Administered 2017-01-17: 12.5 mg via INTRAVENOUS

## 2017-01-17 MED ORDER — ACETAMINOPHEN 325 MG PO TABS: 650.0000 mg | ORAL_TABLET | ORAL | Status: DC | PRN

## 2017-01-17 MED ORDER — DIBUCAINE 1 % RE OINT: 1.0000 "application " | TOPICAL_OINTMENT | RECTAL | Status: DC | PRN

## 2017-01-17 MED ORDER — BUPIVACAINE HCL (PF) 0.5 % IJ SOLN
INTRAMUSCULAR | Status: AC
  Filled 2017-01-17: qty 30

## 2017-01-17 MED ORDER — COCONUT OIL OIL: 1.0000 "application " | TOPICAL_OIL | Status: DC | PRN

## 2017-01-17 MED ORDER — SOD CITRATE-CITRIC ACID 500-334 MG/5ML PO SOLN
ORAL | Status: AC
  Filled 2017-01-17: qty 15

## 2017-01-17 MED ORDER — BUPIVACAINE IN DEXTROSE 0.75-8.25 % IT SOLN
INTRATHECAL | Status: DC | PRN
  Administered 2017-01-17: 1.5 mL via INTRATHECAL

## 2017-01-17 MED ORDER — FENTANYL CITRATE (PF) 100 MCG/2ML IJ SOLN
INTRAMUSCULAR | Status: AC
Start: 2017-01-17 — End: ?
  Filled 2017-01-17: qty 2

## 2017-01-17 MED ORDER — LACTATED RINGERS IV SOLN
INTRAVENOUS | Status: DC
  Administered 2017-01-17: 21:00:00 via INTRAVENOUS

## 2017-01-17 MED ORDER — DIPHENHYDRAMINE HCL 50 MG/ML IJ SOLN
INTRAMUSCULAR | Status: AC
  Filled 2017-01-17: qty 1

## 2017-01-17 MED ORDER — NALBUPHINE HCL 10 MG/ML IJ SOLN
INTRAMUSCULAR | Status: AC
  Filled 2017-01-17: qty 1

## 2017-01-17 MED ORDER — SIMETHICONE 80 MG PO CHEW
80.0000 mg | CHEWABLE_TABLET | ORAL | Status: DC
  Administered 2017-01-18 – 2017-01-19 (×3): 80 mg via ORAL
  Filled 2017-01-17 (×2): qty 1

## 2017-01-17 MED ORDER — IBUPROFEN 600 MG PO TABS
600.0000 mg | ORAL_TABLET | Freq: Four times a day (QID) | ORAL | Status: DC
  Administered 2017-01-17 – 2017-01-19 (×7): 600 mg via ORAL
  Filled 2017-01-17 (×7): qty 1

## 2017-01-17 MED ORDER — ONDANSETRON HCL 4 MG/2ML IJ SOLN
INTRAMUSCULAR | Status: AC
  Filled 2017-01-17: qty 2

## 2017-01-17 MED ORDER — LACTATED RINGERS IV SOLN
INTRAVENOUS | Status: DC
  Administered 2017-01-17: 07:00:00 via INTRAVENOUS

## 2017-01-17 MED ORDER — LACTATED RINGERS IV SOLN: INTRAVENOUS | Status: DC

## 2017-01-17 MED ORDER — SIMETHICONE 80 MG PO CHEW
80.0000 mg | CHEWABLE_TABLET | Freq: Three times a day (TID) | ORAL | Status: DC
  Administered 2017-01-17 – 2017-01-19 (×5): 80 mg via ORAL
  Filled 2017-01-17 (×7): qty 1

## 2017-01-17 MED ORDER — ONDANSETRON HCL 4 MG/2ML IJ SOLN
INTRAMUSCULAR | Status: DC | PRN
  Administered 2017-01-17: 4 mg via INTRAVENOUS

## 2017-01-17 MED ORDER — CEFAZOLIN SODIUM-DEXTROSE 2-4 GM/100ML-% IV SOLN
2.0000 g | INTRAVENOUS | Status: AC
  Administered 2017-01-17: 2 g via INTRAVENOUS

## 2017-01-17 MED ORDER — FENTANYL CITRATE (PF) 100 MCG/2ML IJ SOLN
INTRAMUSCULAR | Status: DC | PRN
Start: 2017-01-17 — End: 2017-01-17
  Administered 2017-01-17: 10 ug via INTRATHECAL

## 2017-01-17 MED ORDER — FENTANYL CITRATE (PF) 100 MCG/2ML IJ SOLN: 25.0000 ug | INTRAMUSCULAR | Status: DC | PRN

## 2017-01-17 MED ORDER — MORPHINE SULFATE (PF) 0.5 MG/ML IJ SOLN
INTRAMUSCULAR | Status: AC
  Filled 2017-01-17: qty 10

## 2017-01-17 MED ORDER — DIPHENHYDRAMINE HCL 25 MG PO CAPS: 25.0000 mg | ORAL_CAPSULE | Freq: Four times a day (QID) | ORAL | Status: DC | PRN

## 2017-01-17 MED ORDER — DIPHENHYDRAMINE HCL 25 MG PO CAPS
25.0000 mg | ORAL_CAPSULE | ORAL | Status: DC | PRN
  Filled 2017-01-17: qty 1

## 2017-01-17 MED ORDER — MENTHOL 3 MG MT LOZG: 1.0000 | LOZENGE | OROMUCOSAL | Status: DC | PRN

## 2017-01-17 MED ORDER — ZOLPIDEM TARTRATE 5 MG PO TABS: 5.0000 mg | ORAL_TABLET | Freq: Every evening | ORAL | Status: DC | PRN

## 2017-01-17 MED ORDER — SOD CITRATE-CITRIC ACID 500-334 MG/5ML PO SOLN
30.0000 mL | Freq: Once | ORAL | Status: AC
  Administered 2017-01-17: 30 mL via ORAL

## 2017-01-17 MED ORDER — KETOROLAC TROMETHAMINE 30 MG/ML IJ SOLN: 30.0000 mg | Freq: Four times a day (QID) | INTRAMUSCULAR | Status: DC | PRN

## 2017-01-17 MED ORDER — PHENYLEPHRINE 8 MG IN D5W 100 ML (0.08MG/ML) PREMIX OPTIME
INJECTION | INTRAVENOUS | Status: AC
  Filled 2017-01-17: qty 100

## 2017-01-17 MED ORDER — DEXAMETHASONE SODIUM PHOSPHATE 4 MG/ML IJ SOLN
INTRAMUSCULAR | Status: DC | PRN
  Administered 2017-01-17: 10 mg via INTRAVENOUS

## 2017-01-17 MED ORDER — PROMETHAZINE HCL 25 MG/ML IJ SOLN: 6.2500 mg | INTRAMUSCULAR | Status: DC | PRN

## 2017-01-17 MED ORDER — KETOROLAC TROMETHAMINE 30 MG/ML IJ SOLN
30.0000 mg | Freq: Four times a day (QID) | INTRAMUSCULAR | Status: DC | PRN
  Administered 2017-01-17: 30 mg via INTRAMUSCULAR

## 2017-01-17 MED ORDER — BUPIVACAINE IN DEXTROSE 0.75-8.25 % IT SOLN
INTRATHECAL | Status: AC
  Filled 2017-01-17: qty 2

## 2017-01-17 MED ORDER — SCOPOLAMINE 1 MG/3DAYS TD PT72
1.0000 | MEDICATED_PATCH | Freq: Once | TRANSDERMAL | Status: DC
  Administered 2017-01-17: 1.5 mg via TRANSDERMAL

## 2017-01-17 MED ORDER — OXYTOCIN 40 UNITS IN LACTATED RINGERS INFUSION - SIMPLE MED: 2.5000 [IU]/h | INTRAVENOUS | Status: DC

## 2017-01-17 MED ORDER — NALBUPHINE HCL 10 MG/ML IJ SOLN
5.0000 mg | INTRAMUSCULAR | Status: DC | PRN
  Administered 2017-01-17: 5 mg via INTRAVENOUS

## 2017-01-17 SURGICAL SUPPLY — 41 items
BARRIER ADHS 3X4 INTERCEED (GAUZE/BANDAGES/DRESSINGS) IMPLANT
CLAMP CORD UMBIL (MISCELLANEOUS) IMPLANT
CLOSURE WOUND 1/2 X4 (GAUZE/BANDAGES/DRESSINGS) ×1
CLOTH BEACON ORANGE TIMEOUT ST (SAFETY) ×3 IMPLANT
CONTAINER PREFILL 10% NBF 15ML (MISCELLANEOUS) IMPLANT
DECANTER SPIKE VIAL GLASS SM (MISCELLANEOUS) ×3 IMPLANT
DRSG OPSITE POSTOP 4X10 (GAUZE/BANDAGES/DRESSINGS) ×3 IMPLANT
DURAPREP 26ML APPLICATOR (WOUND CARE) ×3 IMPLANT
ELECT REM PT RETURN 9FT ADLT (ELECTROSURGICAL) ×3
ELECTRODE REM PT RTRN 9FT ADLT (ELECTROSURGICAL) ×1 IMPLANT
EXTRACTOR VACUUM KIWI (MISCELLANEOUS) IMPLANT
GAUZE SPONGE 4X4 12PLY STRL (GAUZE/BANDAGES/DRESSINGS) ×6 IMPLANT
GLOVE BIO SURGEON STRL SZ 6.5 (GLOVE) ×2 IMPLANT
GLOVE BIO SURGEONS STRL SZ 6.5 (GLOVE) ×1
GLOVE BIOGEL PI IND STRL 7.0 (GLOVE) ×1 IMPLANT
GLOVE BIOGEL PI INDICATOR 7.0 (GLOVE) ×2
GOWN STRL REUS W/TWL LRG LVL3 (GOWN DISPOSABLE) ×6 IMPLANT
KIT ABG SYR 3ML LUER SLIP (SYRINGE) IMPLANT
NEEDLE HYPO 25X5/8 SAFETYGLIDE (NEEDLE) IMPLANT
NEEDLE SPNL 18GX3.5 QUINCKE PK (NEEDLE) ×3 IMPLANT
NS IRRIG 1000ML POUR BTL (IV SOLUTION) ×3 IMPLANT
PACK C SECTION WH (CUSTOM PROCEDURE TRAY) ×3 IMPLANT
PAD ABD 7.5X8 STRL (GAUZE/BANDAGES/DRESSINGS) ×3 IMPLANT
PAD OB MATERNITY 4.3X12.25 (PERSONAL CARE ITEMS) ×3 IMPLANT
PENCIL SMOKE EVAC W/HOLSTER (ELECTROSURGICAL) ×3 IMPLANT
SPONGE GAUZE 4X4 12PLY STER LF (GAUZE/BANDAGES/DRESSINGS) ×6 IMPLANT
STRIP CLOSURE SKIN 1/2X4 (GAUZE/BANDAGES/DRESSINGS) ×2 IMPLANT
SUT PDS AB 0 CTX 60 (SUTURE) IMPLANT
SUT VIC AB 0 CT1 27 (SUTURE)
SUT VIC AB 0 CT1 27XBRD ANBCTR (SUTURE) IMPLANT
SUT VIC AB 0 CT1 36 (SUTURE) IMPLANT
SUT VIC AB 2-0 CT1 27 (SUTURE) ×2
SUT VIC AB 2-0 CT1 TAPERPNT 27 (SUTURE) ×1 IMPLANT
SUT VIC AB 2-0 CTX 36 (SUTURE) ×6 IMPLANT
SUT VIC AB 3-0 CT1 27 (SUTURE) ×2
SUT VIC AB 3-0 CT1 TAPERPNT 27 (SUTURE) ×1 IMPLANT
SUT VIC AB 3-0 SH 27 (SUTURE)
SUT VIC AB 3-0 SH 27X BRD (SUTURE) IMPLANT
SYR 30ML LL (SYRINGE) ×3 IMPLANT
TOWEL OR 17X24 6PK STRL BLUE (TOWEL DISPOSABLE) ×3 IMPLANT
TRAY FOLEY BAG SILVER LF 14FR (SET/KITS/TRAYS/PACK) ×3 IMPLANT

## 2017-01-17 NOTE — Anesthesia Postprocedure Evaluation (Signed)
Anesthesia Post Note  Patient: Soil scientist  Procedure(s) Performed: CESAREAN SECTION (N/A )     Patient location during evaluation: PACU Anesthesia Type: Spinal Level of consciousness: oriented and awake and alert Pain management: pain level controlled Vital Signs Assessment: post-procedure vital signs reviewed and stable Respiratory status: spontaneous breathing, respiratory function stable and nonlabored ventilation Cardiovascular status: blood pressure returned to baseline and stable Postop Assessment: no headache, no backache, no apparent nausea or vomiting, spinal receding and patient able to bend at knees Anesthetic complications: no    Last Vitals:  Vitals:   01/17/17 0910 01/17/17 0915  BP:  115/68  Pulse: 65 74  Resp: 13 (!) 22  Temp:    SpO2: 99% 99%    Last Pain:  Vitals:   01/17/17 0900  TempSrc: Axillary   Pain Goal:                 Cecile Hearing

## 2017-01-17 NOTE — Transfer of Care (Signed)
Immediate Anesthesia Transfer of Care Note  Patient: Emily Mack  Procedure(s) Performed: CESAREAN SECTION (N/A )  Patient Location: PACU  Anesthesia Type:Spinal  Level of Consciousness: awake, alert  and oriented  Airway & Oxygen Therapy: Patient Spontanous Breathing  Post-op Assessment: Report given to RN and Post -op Vital signs reviewed and stable  Post vital signs: Reviewed and stable  Last Vitals:  Vitals:   01/17/17 0633  BP: 128/83  Pulse: 95  Resp: 18  Temp: 37 C  SpO2: 100%    Last Pain:  Vitals:   01/17/17 0633  TempSrc: Oral         Complications: No apparent anesthesia complications

## 2017-01-17 NOTE — Anesthesia Postprocedure Evaluation (Signed)
Anesthesia Post Note  Patient: Soil scientist  Procedure(s) Performed: CESAREAN SECTION (N/A )     Patient location during evaluation: Mother Baby Anesthesia Type: Spinal Level of consciousness: awake and alert and oriented Pain management: satisfactory to patient Vital Signs Assessment: post-procedure vital signs reviewed and stable Respiratory status: spontaneous breathing and nonlabored ventilation Cardiovascular status: stable Postop Assessment: no headache, no backache, patient able to bend at knees, no signs of nausea or vomiting and adequate PO intake Anesthetic complications: no    Last Vitals:  Vitals:   01/17/17 0935 01/17/17 0954  BP:  105/70  Pulse: 79 67  Resp: (!) 21 20  Temp:  36.6 C  SpO2: 98% 98%    Last Pain:  Vitals:   01/17/17 0954  TempSrc: Oral   Pain Goal:                 Madison Hickman

## 2017-01-17 NOTE — H&P (Signed)
Marland KitchenLABOR ADMISSION HISTORY AND PHYSICAL 26 yo MW G3P1 at 39 weeks here for a RLTCS. She declines a trial of labor.   [facility]  Prenatal History/Complications: Uncomplicated prenatal course  Past Medical History:     Past Medical History  Diagnosis Date  . Medical history non-contributory     Past Surgical History:       Past Surgical History  Procedure Laterality Date  . Tympanoplasty      age 1    Obstetrical History:             OB History   Grav Para Term Preterm Abortions TAB SAB Ect Mult Living   2         1      Social History: History        Social History  . Marital Status: Married    Spouse Name: N/A    Number of Children: N/A  . Years of Education: N/A       Social History Main Topics  . Smoking status: Never Smoker   . Smokeless tobacco: Never Used  . Alcohol Use: No  . Drug Use: No  . Sexual Activity: Yes       Other Topics Concern  . None      Social History Narrative  . None    Family History:      Family History  Problem Relation Age of Onset  . Diabetes Mother     Allergies: No Known Allergies        Prescriptions prior to admission  Medication Sig Dispense Refill  . acetaminophen (TYLENOL) 500 MG tablet Take 1,000 mg by mouth once.      . calcium carbonate (TUMS - DOSED IN MG ELEMENTAL CALCIUM) 500 MG chewable tablet Chew 2 tablets by mouth daily as needed for indigestion or heartburn.       . Prenatal Vit-Fe Fumarate-FA (PRENATAL MULTIVITAMIN) TABS tablet Take 1 tablet by mouth daily at 12 noon.         Review of Systems   All systems reviewed and negative except as stated in HPI  Blood pressure 131/70, pulse 92, temperature 98.5 F (36.9 C), temperature source Oral, resp. rate 18, height  (1.473 m), weight 69.4 kg (153 lb), last menstrual period 11/14/2012. General appearance: alert, cooperative and no distress Lungs: clear to auscultation  bilaterally Heart: regular rate and rhythm Abdomen: soft, non-tender; bowel sounds normal Extremities: Homans sign is negative, no sign of DVT Presentation: cephalic   Prenatal labs: ABO, Rh: A/POS/-- (01/21 1107) Antibody: NEG (01/21 1107) Rubella:   RPR: NON REAC (02/27 0932)  HBsAg: NEGATIVE (01/21 1107)  HIV: NON REACTIVE (02/27 0932)   Genetic screening  1 Screen:  WNL    AFP:    WNL   Anatomy US Normal    Prenatal Transfer Tool  Maternal Diabetes: No Genetic Screening: Normal Maternal Ultrasounds/Referrals: Normal Fetal Ultrasounds or other Referrals:  None Maternal Substance Abuse:  No Significant Maternal Medications:  None Significant Maternal Lab Results: Lab values include: Group B Strep negative       Clinic  [facility]  Dating LMP/Ultrasound:7  weeks        Ultrasound consistent with LMP: Yes  Genetic Screen 1 Screen:  WNL               AFP:    WNL                  Anatomic [place]  Normal  GTT Third trimester: normal  TDaP vaccine  2/17  Flu vaccine declines  GBS Negative  Baby Food Breast  Contraception None due to religion  Circumcision Yes  Pediatrician Undecided  Support Person: Husband   Assessment: Manaia Samad is a 72 y.o. G2P1 for a RLTCS.  She understands the risks of surgery, including, but not to infection, bleeding, DVTs, damage to bowel, bladder, ureters. She wishes to proceed.She decllines a BTL. She would like her skin lesion at her C/S scar removed.      #Labor: progressing, NSVD #Pain:  Fentanyl  #FWB: Category 1  #ID:      GBS neg #MOF: breast  #MOC: none due to religion  #Circ:   Yes

## 2017-01-17 NOTE — Addendum Note (Signed)
Addendum  created 01/17/17 1433 by Shanon Payor, CRNA   Sign clinical note

## 2017-01-17 NOTE — Op Note (Signed)
The risks, benefits, and alternatives of surgery were explained, understood, accepted. Consents were signed. All questions were answered. In the operating room spinal anesthesia was applied without complication. Her abdomen and vagina were prepped and draped in the usual sterile fashion. A Foley catheter was placed, draining clear urine throughout case. Timeout procedure was done. After adequate anesthesia was assured 30 mL for 0.5% Marcaine was injected into the subcutaneous tissue at the site of her previous cesarean. An incision was made through the previous incision. I also excised the associated skin lesion (?keloid). The incision was carried down through the subcutaneous tissue to the fascia. The fascia was scored the midline and extended bilaterally. The middle 50% of the rectus muscles were separated in a transverse fashion using electrosurgical technique. Excellent hemostasis was maintained. The peritoneum was entered with hemostats. Peritoneal incision was extended bilaterally with the Bovie. The bladder blade was placed. There were minimal adhesions from her previous surgery. A transverse incision was made on the well-developed lower uterine segment. The uterine incision was extended with traction on each side. Amniotomy was performed with a hemostat. Clear fluid was noted. The baby was delivered from a vertex presentation.the mouth and nostrils were suctioned prior to delivery of the shoulders. A double nuchal cord was reduced prior to delivery of the shoulders. The baby's cord was clamped and cut and was transferred to the NICU personnel for routine care after 1 minute. The placenta was delivered intact with traction. The uterus was left in situ and the interior was cleaned with a dry lap sponge. The uterine incision was closed with 2-0 Vicryl running locking suture in 2 layers, the second layer imbricating the first. Excellent hemostasis was noted. By tilting the uterus each side was able to visualize  the adnexa, and they were normal. The rectus fascia rectus muscles were noted be hemostatic as well. The fascia was closed with a #1 PDS loop in a running nonlocking fashion. No defects were palpable. The subcutaneous tissue was irrigated, clean, and dried. A subcuticular closure was done with a 3-0 Vicryl suture. Steri-Strips are placed. Excellent cosmetic results were obtained. She was taken to the recovery room in stable condition. She tolerated the procedure well.

## 2017-01-17 NOTE — Anesthesia Procedure Notes (Signed)
Spinal  Patient location during procedure: OR Start time: 01/17/2017 7:31 AM End time: 01/17/2017 7:33 AM Staffing Anesthesiologist: Cecile Hearing Performed: anesthesiologist  Preanesthetic Checklist Completed: patient identified, surgical consent, pre-op evaluation, timeout performed, IV checked, risks and benefits discussed and monitors and equipment checked Spinal Block Patient position: sitting Prep: site prepped and draped and DuraPrep Patient monitoring: continuous pulse ox and blood pressure Approach: midline Location: L3-4 Injection technique: single-shot Needle Needle type: Pencan  Needle gauge: 24 G Assessment Sensory level: T4 Additional Notes Functioning IV was confirmed and monitors were applied. Sterile prep and drape, including hand hygiene, mask and sterile gloves were used. The patient was positioned and the spine was prepped. The skin was anesthetized with lidocaine.  Free flow of clear CSF was obtained prior to injecting local anesthetic into the CSF.  The spinal needle aspirated freely following injection.  The needle was carefully withdrawn.  The patient tolerated the procedure well. Consent was obtained prior to procedure with all questions answered and concerns addressed. Risks including but not limited to bleeding, infection, nerve damage, paralysis, failed block, inadequate analgesia, allergic reaction, high spinal, itching and headache were discussed and the patient wished to proceed.   Arrie Aran, MD

## 2017-01-17 NOTE — Anesthesia Preprocedure Evaluation (Addendum)
Anesthesia Evaluation  Patient identified by MRN, date of birth, ID band Patient awake    Reviewed: Allergy & Precautions, NPO status , Patient's Chart, lab work & pertinent test results  Airway Mallampati: II  TM Distance: >3 FB Neck ROM: Full    Dental  (+) Teeth Intact, Dental Advisory Given   Pulmonary neg pulmonary ROS,    Pulmonary exam normal breath sounds clear to auscultation       Cardiovascular negative cardio ROS Normal cardiovascular exam Rhythm:Regular Rate:Normal     Neuro/Psych negative neurological ROS     GI/Hepatic negative GI ROS, Neg liver ROS,   Endo/Other  negative endocrine ROSObesity   Renal/GU negative Renal ROS     Musculoskeletal negative musculoskeletal ROS (+)   Abdominal   Peds  Hematology  (+) Blood dyscrasia, anemia , Plt 283k   Anesthesia Other Findings Day of surgery medications reviewed with the patient.  Reproductive/Obstetrics (+) Pregnancy                            Anesthesia Physical Anesthesia Plan  ASA: II  Anesthesia Plan: Spinal   Post-op Pain Management:    Induction:   PONV Risk Score and Plan: 2 and Ondansetron, Dexamethasone, Scopolamine patch - Pre-op and Treatment may vary due to age or medical condition  Airway Management Planned:   Additional Equipment:   Intra-op Plan:   Post-operative Plan:   Informed Consent: I have reviewed the patients History and Physical, chart, labs and discussed the procedure including the risks, benefits and alternatives for the proposed anesthesia with the patient or authorized representative who has indicated his/her understanding and acceptance.   Dental advisory given  Plan Discussed with: CRNA, Anesthesiologist and Surgeon  Anesthesia Plan Comments: (Discussed risks and benefits of and differences between spinal and general. Discussed risks of spinal including headache, backache, failure,  bleeding, infection, and nerve damage. Patient consents to spinal. Questions answered. Coagulation studies and platelet count acceptable.)        Anesthesia Quick Evaluation

## 2017-01-18 ENCOUNTER — Telehealth: Payer: Self-pay | Admitting: Radiology

## 2017-01-18 LAB — BIRTH TISSUE RECOVERY COLLECTION (PLACENTA DONATION)

## 2017-01-18 LAB — CBC
HEMATOCRIT: 27.1 % — AB (ref 36.0–46.0)
Hemoglobin: 9 g/dL — ABNORMAL LOW (ref 12.0–15.0)
MCH: 28.8 pg (ref 26.0–34.0)
MCHC: 33.2 g/dL (ref 30.0–36.0)
MCV: 86.6 fL (ref 78.0–100.0)
Platelets: 207 10*3/uL (ref 150–400)
RBC: 3.13 MIL/uL — ABNORMAL LOW (ref 3.87–5.11)
RDW: 14.5 % (ref 11.5–15.5)
WBC: 13.1 10*3/uL — ABNORMAL HIGH (ref 4.0–10.5)

## 2017-01-18 MED ORDER — NALOXONE HCL 0.4 MG/ML IJ SOLN: 0.4000 mg | INTRAMUSCULAR | Status: DC | PRN

## 2017-01-18 MED ORDER — OXYCODONE-ACETAMINOPHEN 5-325 MG PO TABS: 1.0000 | ORAL_TABLET | ORAL | Status: DC | PRN

## 2017-01-18 MED ORDER — ACETAMINOPHEN 500 MG PO TABS
1000.0000 mg | ORAL_TABLET | Freq: Four times a day (QID) | ORAL | Status: DC
  Administered 2017-01-18: 1000 mg via ORAL
  Filled 2017-01-18: qty 2

## 2017-01-18 MED ORDER — OXYCODONE-ACETAMINOPHEN 5-325 MG PO TABS
2.0000 | ORAL_TABLET | ORAL | Status: DC | PRN
  Administered 2017-01-18 – 2017-01-19 (×3): 2 via ORAL
  Filled 2017-01-18 (×3): qty 2

## 2017-01-18 MED ORDER — SODIUM CHLORIDE 0.9% FLUSH: 3.0000 mL | INTRAVENOUS | Status: DC | PRN

## 2017-01-18 MED ORDER — ONDANSETRON HCL 4 MG/2ML IJ SOLN: 4.0000 mg | Freq: Three times a day (TID) | INTRAMUSCULAR | Status: DC | PRN

## 2017-01-18 MED ORDER — SCOPOLAMINE 1 MG/3DAYS TD PT72
1.0000 | MEDICATED_PATCH | Freq: Once | TRANSDERMAL | Status: DC
  Filled 2017-01-18: qty 1

## 2017-01-18 MED ORDER — NALBUPHINE HCL 10 MG/ML IJ SOLN: 5.0000 mg | Freq: Once | INTRAMUSCULAR | Status: DC | PRN

## 2017-01-18 MED ORDER — MEPERIDINE HCL 25 MG/ML IJ SOLN: 6.2500 mg | INTRAMUSCULAR | Status: DC | PRN

## 2017-01-18 MED ORDER — NALOXONE HCL 2 MG/2ML IJ SOSY
1.0000 ug/kg/h | PREFILLED_SYRINGE | INTRAVENOUS | Status: DC | PRN
  Filled 2017-01-18: qty 2

## 2017-01-18 NOTE — Telephone Encounter (Signed)
Left voicemail on cell phone for patient to call [facility] to schedule 2 wk incision check and 4 wk postpartum

## 2017-01-18 NOTE — Progress Notes (Signed)
Post Partum Day 1 Subjective: no complaints, up ad lib, voiding and tolerating PO  Objective: Blood pressure (!) 93/53, pulse 66, temperature 97.9 F (36.6 C), temperature source Oral, resp. rate 18, height  (1.473 m), weight 155 lb (70.3 kg), last menstrual period 04/19/2016, SpO2 95 %, unknown if currently breastfeeding.  Physical Exam:  General: alert, cooperative and no distress Lochia: appropriate Uterine Fundus: firm Incision: healing well, no significant drainage, no dehiscence, no significant erythema DVT Evaluation: No evidence of DVT seen on physical exam. No cords or calf tenderness.   Recent Labs  01/15/17 0940 01/18/17 0545  HGB 11.9* 9.0*  HCT 35.1* 27.1*    Assessment/Plan: Plan for discharge tomorrow, Breastfeeding and Contraception none   LOS: 1 day   Arlyce Harman 01/18/2017, 8:50 AM

## 2017-01-18 NOTE — Lactation Note (Addendum)
This note was copied from a baby's chart. Lactation Consultation Note  Patient Name: Emily Mack [id number] Date: 01/18/2017 Reason for consult: Follow-up assessment   Baby 31 hours.  Ex BF for 2 years. Mother knows how to hand epxress.  Denies questions or problems. Baby latched upon entering.  Intermittent swallows observed. Mom encouraged to feed baby 8-12 times/24 hours and with feeding cues.  Mom made aware of O/P services, breastfeeding support groups, community resources, and our phone # for post-discharge questions.     Maternal Data    Feeding Feeding Type: Breast Fed Length of feed: 15 min (per mom)  LATCH Score                   Interventions    Lactation Tools Discussed/Used     Consult Status Consult Status: Complete    Hardie Pulley 01/18/2017, 12:10 PM

## 2017-01-19 MED ORDER — OXYCODONE-ACETAMINOPHEN 5-325 MG PO TABS: 1.0000 | ORAL_TABLET | ORAL | 0 refills | Status: DC | PRN

## 2017-01-19 NOTE — Discharge Summary (Signed)
OB Discharge Summary  Patient Name: Emily Mack DOB: [birth year] MRN: [id number]  Date of admission: 01/17/2017 Delivering MD: Nicholaus Bloom C   Date of discharge: 01/19/2017  Admitting diagnosis: repeat Intrauterine pregnancy: [redacted]w[redacted]d     Secondary diagnosis:Active Problems:   Previous cesarean delivery, antepartum   Postpartum care following cesarean delivery  Additional problems:CF carrier     Discharge diagnosis: Term Pregnancy Delivered                                                                     Post partum procedures:none  Augmentation: none  Complications: None  Hospital course:  Sceduled C/S   26 y.o. yo G2P2002 at [redacted]w[redacted]d was admitted to the hospital 01/17/2017 for scheduled cesarean section with the following indication:Elective Repeat.  Membrane Rupture Time/Date: 7:49 AM ,01/17/2017   Patient delivered a Viable infant.01/17/2017  Details of operation can be found in separate operative note.  Pateint had an uncomplicated postpartum course.  She is ambulating, tolerating a regular diet, passing flatus, and urinating well. Patient is discharged home in stable condition on  01/19/17         Physical exam  Vitals:   01/18/17 0515 01/18/17 0945 01/18/17 1916 01/19/17 0605  BP: (!) 93/53 118/71 106/70 119/65  Pulse: 66 88 84 75  Resp: Temp: 97.9 F (36.6 C) (!) 97.3 F (36.3 C) 97.9 F (36.6 C) 98.6 F (37 C)  TempSrc: Oral Oral Oral Oral  SpO2:      Weight:      Height:       General: alert, cooperative and no distress Lochia: appropriate Uterine Fundus: firm Incision: Healing well with no significant drainage DVT Evaluation: No evidence of DVT seen on physical exam. Labs: Lab Results  Component Value Date   WBC 13.1 (H) 01/18/2017   HGB 9.0 (L) 01/18/2017   HCT 27.1 (L) 01/18/2017   MCV 86.6 01/18/2017   PLT 207 01/18/2017   CMP Latest Ref Rng & Units 08/25/2013  Glucose 70 - 99 mg/dL 478(G)  BUN 6 - 23 mg/dL 5(L)   Creatinine 9.56 - 1.10 mg/dL 2.13  Sodium 086 - 578 mEq/L 136(L)  Potassium 3.7 - 5.3 mEq/L 4.5  Chloride 96 - 112 mEq/L 101  CO2 19 - 32 mEq/L 23  Calcium 8.4 - 10.5 mg/dL 8.9    Discharge instruction: per After Visit Summary and "Baby and Me Booklet".  After Visit Meds:  Allergies as of 01/19/2017   No Known Allergies     Medication List    TAKE these medications   oxyCODONE-acetaminophen 5-325 MG tablet Commonly known as:  PERCOCET/ROXICET Take 1 tablet by mouth every 4 (four) hours as needed (pain scale 4-7).   prenatal multivitamin Tabs tablet Take 1 tablet by mouth daily.       Diet: routine diet  Activity: Advance as tolerated. Pelvic rest for 6 weeks.   Outpatient follow up:6 weeks Follow up Appt:No future appointments. Follow up visit: No Follow-up on file.  Postpartum contraception: None  Newborn Data: Live born female  Birth Weight: 8 lb (3630 g) APGAR: 9, 9  Newborn Delivery   Birth date/time:  01/17/2017 07:50:00 Delivery type:  C-Section, Low Transverse  C-section categorization:  Repeat     Baby Feeding: Breast Disposition:home with mother   01/19/2017 Reva Bores, MD

## 2017-01-19 NOTE — Discharge Instructions (Signed)

## 2017-02-01 ENCOUNTER — Ambulatory Visit (INDEPENDENT_AMBULATORY_CARE_PROVIDER_SITE_OTHER): Payer: BLUE CROSS/BLUE SHIELD | Admitting: *Deleted

## 2017-02-01 ENCOUNTER — Encounter: Payer: Self-pay | Admitting: *Deleted

## 2017-02-01 DIAGNOSIS — Z4889 Encounter for other specified surgical aftercare: Secondary | ICD-10-CM

## 2017-02-01 NOTE — Progress Notes (Signed)
Subjective:     Coralie Carpeniffany Haan is a 26 y.o. female who presents to the clinic 2 weeks status post transverse cesarean section. Eating a regular diet without difficulty. Bowel movements are normal. Pain is controlled with current analgesics. Medications being used: narcotic analgesics including oxycodone/acetaminophen (Percocet, Tylox) and Tylenol.   Objective:    LMP 04/19/2016  General:  alert, cooperative and no distress     Incision:   healing well, no drainage, no erythema, no hernia, no seroma, no swelling, no dehiscence, incision well approximated     Assessment:    Doing well postoperatively.   Plan:    1. Continue any current medications. 2. Wound care discussed. 3. Activity restrictions: none 4. Anticipated return to work: 4 weeks. 5. Follow up: 3 weeks for Postpartum Visit.   Janit BernMandy Hutchinson, CMA

## 2017-02-15 ENCOUNTER — Ambulatory Visit (INDEPENDENT_AMBULATORY_CARE_PROVIDER_SITE_OTHER): Payer: BLUE CROSS/BLUE SHIELD | Admitting: Obstetrics and Gynecology

## 2017-02-15 ENCOUNTER — Encounter: Payer: Self-pay | Admitting: Obstetrics and Gynecology

## 2017-02-17 NOTE — Progress Notes (Signed)
Obstetrics Visit Postpartum Visit  Appointment Date: 02/15/2017  OBGYN Clinic: [facility]  Primary Care Provider: Patient, No Pcp Per  Chief Complaint:  Chief Complaint  Patient presents with  . Postpartum Care    History of Present Illness: Emily Mack is a 26 y.o. [id number] (No LMP recorded.), seen for the above chief complaint. Her past medical history is significant for nothing.    She is s/p rLTCS and keloid removal on 10/14 with Dr. Marice Potterove; she was discharged to home on PPD#2  Vaginal bleeding or discharge: No  Breast or formula feeding: Breast Intercourse: No  Contraception after delivery: No  PP depression s/s: No  Any bowel or bladder issues: No  Pap smear: no abnormalities (date: 2016)  Review of Systems:  as noted in the History of Present Illness.  Patient Active Problem List   Diagnosis Date Noted  . Postpartum care following cesarean delivery 01/17/2017  . MRSA carrier 12/16/2016  . Fetal abnormality affecting management of mother, antepartum 10/12/2016  . Previous cesarean delivery, antepartum 08/21/2016  . Cystic fibrosis carrier 07/01/2016  . Asymptomatic bacteriuria during pregnancy 07/01/2016  . Supervision of normal pregnancy, antepartum 06/18/2016    Medications Emily Mack had no medications administered during this visit. Current Outpatient Medications  Medication Sig Dispense Refill  . Prenatal Vit-Fe Fumarate-FA (PRENATAL MULTIVITAMIN) TABS tablet Take 1 tablet by mouth daily.      No current facility-administered medications for this visit.     Allergies Patient has no known allergies.  Physical Exam:  BP 135/82   Pulse 81   Ht 4\' 10"  (1.473 m)   Wt 131 lb (59.4 kg)   Breastfeeding? Yes   BMI 27.38 kg/m  Body mass index is 27.38 kg/m. General appearance: Well nourished, well developed female in no acute distress.  Cardiovascular: normal s1 and s2.  No murmurs, rubs or gallops. Respiratory:   Clear to auscultation bilateral. Normal respiratory effort Abdomen: positive bowel sounds and no masses, hernias; diffusely non tender to palpation, non distended. Well healed low transverse skin incision. No e/o keloid Neuro/Psych:  Normal mood and affect.  Skin:  Warm and dry.   Laboratory: none  PP Depression Screening:  EPDS score: 1  Assessment: pt doing well  Plan:  Routine care. Pt declines contraception. D/w her that if she decides on anything to let us know. Pt aware that if spaces out breastfeeding/pumping to >q2-3h then periods may resume and she can get pregnant. Pt told to rpt pap in one year.   RTC PRN  Cornelia Copaharlie Suesan Mohrmann, Jr MD Attending [facility](Faculty Practice)

## 2018-11-16 ENCOUNTER — Ambulatory Visit (INDEPENDENT_AMBULATORY_CARE_PROVIDER_SITE_OTHER): Payer: BC Managed Care – PPO | Admitting: Family Medicine

## 2018-11-16 ENCOUNTER — Encounter: Payer: Self-pay | Admitting: Family Medicine

## 2018-11-16 ENCOUNTER — Other Ambulatory Visit: Payer: Self-pay

## 2018-11-16 VITALS — BP 145/93 | HR 93 | Wt 130.0 lb

## 2018-11-16 DIAGNOSIS — O0990 Supervision of high risk pregnancy, unspecified, unspecified trimester: Secondary | ICD-10-CM | POA: Insufficient documentation

## 2018-11-16 DIAGNOSIS — Z113 Encounter for screening for infections with a predominantly sexual mode of transmission: Secondary | ICD-10-CM | POA: Diagnosis not present

## 2018-11-16 DIAGNOSIS — Z98891 History of uterine scar from previous surgery: Secondary | ICD-10-CM

## 2018-11-16 DIAGNOSIS — Z141 Cystic fibrosis carrier: Secondary | ICD-10-CM

## 2018-11-16 DIAGNOSIS — Z3481 Encounter for supervision of other normal pregnancy, first trimester: Secondary | ICD-10-CM | POA: Diagnosis not present

## 2018-11-16 DIAGNOSIS — Z1151 Encounter for screening for human papillomavirus (HPV): Secondary | ICD-10-CM

## 2018-11-16 DIAGNOSIS — Z3A11 11 weeks gestation of pregnancy: Secondary | ICD-10-CM

## 2018-11-16 DIAGNOSIS — Z3480 Encounter for supervision of other normal pregnancy, unspecified trimester: Secondary | ICD-10-CM | POA: Insufficient documentation

## 2018-11-16 DIAGNOSIS — Z124 Encounter for screening for malignant neoplasm of cervix: Secondary | ICD-10-CM

## 2018-11-16 DIAGNOSIS — R8271 Bacteriuria: Secondary | ICD-10-CM

## 2018-11-16 DIAGNOSIS — Z3490 Encounter for supervision of normal pregnancy, unspecified, unspecified trimester: Secondary | ICD-10-CM | POA: Insufficient documentation

## 2018-11-16 MED ORDER — BLOOD PRESSURE CUFF MISC: 1.0000 | 0 refills | Status: AC

## 2018-11-16 NOTE — Progress Notes (Signed)
DATING AND VIABILITY SONOGRAM   Zi Sek is a 28 y.o. year old G58P2002 with LMP Patient's last menstrual period was 08/26/2018 (exact date). which would correlate to  [redacted]w[redacted]d weeks gestation.  She has regular menstrual cycles.   She is here today for a confirmatory initial sonogram.    GESTATION: SINGLETON yes     FETAL ACTIVITY:          Heart rate     170          The fetus is active.  GESTATIONAL AGE AND  BIOMETRICS:  Gestational criteria: Estimated Date of Delivery: 06/02/19 by LMP now at [redacted]w[redacted]d  Previous Scans:0  GESTATIONAL SAC            mm         weeks  CROWN RUMP LENGTH           4.12 mm        11.0 weeks                                                   AVERAGE EGA(BY THIS SCAN):  11.0 weeks  WORKING EDD( LMP ):  06/02/2019     TECHNICIAN COMMENTS:  Patient informed that the ultrasound is considered a limited obstetric ultrasound and is not intended to be a complete ultrasound exam. Patient also informed that the ultrasound is not being completed with the intent of assessing for fetal or placental anomalies or any pelvic abnormalities. Explained that the purpose of today's ultrasound is to assess for fetal heart rate. Patient acknowledges the purpose of the exam and the limitations of the study.     A copy of this report including all images has been saved and backed up to a second source for retrieval if needed. All measures and details of the anatomical scan, placentation, fluid volume and pelvic anatomy are contained in that report.  Crosby Oyster 11/16/2018 3:23 PM

## 2018-11-16 NOTE — Progress Notes (Signed)
Subjective:   Emily Mack is a 28 y.o. G3P2002 at 5570w5d by LMP, early ultrasound being seen today for her first obstetrical visit.  Her obstetrical history is significant for previous C-section x 2. Patient does intend to breast feed. Pregnancy history fully reviewed.  Patient reports no complaints.  HISTORY: OB History  Gravida Para Term Preterm AB Living  3 2 2  0 0 2  SAB TAB Ectopic Multiple Live Births  0 0 0 0 2    # Outcome Date GA Lbr Len/2nd Weight Sex Delivery Anes PTL Lv  3 Current           2 Term 01/17/17 6574w0d  8 lb (3.63 kg) M CS-LTranv Spinal  LIV     Name: Boulier,BOY Demetra     Apgar1: 9  Apgar5: 9  1 Term 08/24/13 3134w3d  6 lb 10.9 oz (3.03 kg) M CS-LTranv EPI  LIV     Name: Ferris,BOY Vinetta     Apgar1: 9  Apgar5: 9   Last pap smear was  2016 and was normal Past Medical History:  Diagnosis Date  . Asymptomatic bacteriuria during pregnancy 07/01/2016   >100,000 e.coli in initial OB urine culture.     Past Surgical History:  Procedure Laterality Date  . CESAREAN SECTION N/A 08/24/2013   Procedure: CESAREAN SECTION;  Surgeon: Allie BossierMyra C Dove, MD;  Location: [facility];  Service: Obstetrics;  Laterality: N/A;  . CESAREAN SECTION N/A 01/17/2017   Procedure: CESAREAN SECTION;  Surgeon: Allie Bossierove, Myra C, MD;  [facility];  Service: Obstetrics;  Laterality: N/A;  . TYMPANOPLASTY     age 89   Family History  Problem Relation Age of Onset  . Diabetes Mother   . Hearing loss Mother   . Hearing loss Paternal Uncle   . Hearing loss Paternal Grandfather    Social History   Tobacco Use  . Smoking status: Never Smoker  . Smokeless tobacco: Never Used  Substance Use Topics  . Alcohol use: No  . Drug use: No   No Known Allergies Current Outpatient Medications on File Prior to Visit  Medication Sig Dispense Refill  . doxylamine, Sleep, (UNISOM) 25 MG tablet Take 25 mg by mouth at bedtime as needed.    . Prenatal Vit-Fe Fumarate-FA (PRENATAL  MULTIVITAMIN) TABS tablet Take 1 tablet by mouth daily.     . vitamin B-6 (PYRIDOXINE) 25 MG tablet Take 25 mg by mouth 2 (two) times daily.    Marland Kitchen. oxyCODONE-acetaminophen (PERCOCET/ROXICET) 5-325 MG tablet Take 1 tablet by mouth every 4 (four) hours as needed (pain scale 4-7). (Patient not taking: Reported on 11/16/2018) 30 tablet 0   No current facility-administered medications on file prior to visit.      Exam   Vitals:   11/16/18 1511  BP: (!) 145/93  Pulse: 93  Weight: 130 lb (59 kg)      Uterus:   12 wk size  Pelvic Exam: Perineum: no hemorrhoids, normal perineum   Vulva: normal external genitalia, no lesions   Vagina:  normal mucosa, normal discharge   Cervix: no lesions and normal, pap smear done.    Adnexa: normal adnexa and no mass, fullness, tenderness   Bony Pelvis: average  System: General: well-developed, well-nourished female in no acute distress   Breast:  normal appearance, no masses or tenderness   Skin: normal coloration and turgor, no rashes   Neurologic: oriented, normal, negative, normal mood   Extremities: normal strength, tone, and muscle  mass, ROM of all joints is normal   HEENT PERRLA, extraocular movement intact and sclera clear, anicteric   Mouth/Teeth mucous membranes moist, pharynx normal without lesions and dental hygiene good   Neck supple and no masses   Cardiovascular: regular rate and rhythm   Respiratory:  no respiratory distress, normal breath sounds   Abdomen: soft, non-tender; bowel sounds normal; no masses,  no organomegaly    Limited OB US reveals SIUP + flicker at 11 wks Assessment:   Pregnancy: [id number] Patient Active Problem List   Diagnosis Date Noted  . Supervision of normal pregnancy 11/16/2018  . MRSA carrier 12/16/2016  . Previous cesarean section 08/21/2016  . Cystic fibrosis carrier 07/01/2016     Plan:  1. Encounter for supervision of other normal pregnancy in first trimester New OB labs - Babyscripts Schedule  Optimization - Obstetric Panel, Including HIV - Culture, OB Urine - Cytology - PAP( [place]) - [place] MFM OB COMP + 53 WK; Future - Enroll Patient in Babyscripts  2. Cystic fibrosis carrier Partner is not a carrier  3. Previous cesarean section Will need a repeat   Initial labs drawn. Continue prenatal vitamins. Genetic Screening discussed, NIPS: ordered. Ultrasound discussed; fetal anatomic survey: at 18-20 wks. Problem list reviewed and updated. The nature of [place] with multiple MDs and other Advanced Practice Providers was explained to patient; also emphasized that residents, students are part of our team. Routine obstetric precautions reviewed. Return in 4 weeks (on 12/14/2018).

## 2018-11-16 NOTE — Patient Instructions (Signed)

## 2018-11-17 LAB — OBSTETRIC PANEL, INCLUDING HIV
Antibody Screen: NEGATIVE
Basophils Absolute: 0 10*3/uL (ref 0.0–0.2)
Basos: 0 %
EOS (ABSOLUTE): 0.2 10*3/uL (ref 0.0–0.4)
Eos: 1 %
HIV Screen 4th Generation wRfx: NONREACTIVE
Hematocrit: 38.5 % (ref 34.0–46.6)
Hemoglobin: 13 g/dL (ref 11.1–15.9)
Hepatitis B Surface Ag: NEGATIVE
Immature Grans (Abs): 0 10*3/uL (ref 0.0–0.1)
Immature Granulocytes: 0 %
Lymphocytes Absolute: 2.2 10*3/uL (ref 0.7–3.1)
Lymphs: 16 %
MCH: 28 pg (ref 26.6–33.0)
MCHC: 33.8 g/dL (ref 31.5–35.7)
MCV: 83 fL (ref 79–97)
Monocytes Absolute: 0.8 10*3/uL (ref 0.1–0.9)
Monocytes: 6 %
Neutrophils Absolute: 10.6 10*3/uL — ABNORMAL HIGH (ref 1.4–7.0)
Neutrophils: 77 %
Platelets: 351 10*3/uL (ref 150–450)
RBC: 4.65 x10E6/uL (ref 3.77–5.28)
RDW: 13 % (ref 11.7–15.4)
RPR Ser Ql: NONREACTIVE
Rh Factor: POSITIVE
Rubella Antibodies, IGG: 2 index (ref >0.99)
WBC: 13.8 10*3/uL — ABNORMAL HIGH (ref 3.4–10.8)

## 2018-11-18 LAB — CYTOLOGY - PAP
Adequacy: ABSENT
Chlamydia: NEGATIVE
Diagnosis: NEGATIVE
HPV: NOT DETECTED
Neisseria Gonorrhea: NEGATIVE

## 2018-11-19 LAB — URINE CULTURE, OB REFLEX

## 2018-11-19 LAB — CULTURE, OB URINE

## 2018-11-21 MED ORDER — CEPHALEXIN 500 MG PO CAPS
500.0000 mg | ORAL_CAPSULE | Freq: Four times a day (QID) | ORAL | 2 refills | Status: DC
Start: 2018-11-21 — End: 2020-01-31

## 2018-11-21 NOTE — Addendum Note (Signed)
Addended by: Donnamae Jude on: 11/21/2018 08:17 AM   Modules accepted: Orders

## 2018-12-14 ENCOUNTER — Other Ambulatory Visit: Payer: Self-pay

## 2018-12-14 ENCOUNTER — Ambulatory Visit (INDEPENDENT_AMBULATORY_CARE_PROVIDER_SITE_OTHER): Payer: BC Managed Care – PPO | Admitting: Family Medicine

## 2018-12-14 VITALS — BP 128/84 | HR 94 | Wt 131.0 lb

## 2018-12-14 DIAGNOSIS — Z3A15 15 weeks gestation of pregnancy: Secondary | ICD-10-CM | POA: Diagnosis not present

## 2018-12-14 DIAGNOSIS — R8271 Bacteriuria: Secondary | ICD-10-CM

## 2018-12-14 DIAGNOSIS — Z141 Cystic fibrosis carrier: Secondary | ICD-10-CM

## 2018-12-14 DIAGNOSIS — Z3482 Encounter for supervision of other normal pregnancy, second trimester: Secondary | ICD-10-CM

## 2018-12-14 DIAGNOSIS — Z98891 History of uterine scar from previous surgery: Secondary | ICD-10-CM

## 2018-12-14 NOTE — Progress Notes (Signed)
   PRENATAL VISIT NOTE  Subjective:  Emily Mack is a 28 y.o. G3P2002 at [redacted]w[redacted]d being seen today for ongoing prenatal care.  She is currently monitored for the following issues for this high-risk pregnancy and has Cystic fibrosis carrier; Asymptomatic bacteriuria; Previous cesarean section; MRSA carrier; and Supervision of normal pregnancy on their problem list.  Patient reports no complaints.  Contractions: Not present. Vag. Bleeding: None.  Movement: Absent. Denies leaking of fluid.   The following portions of the patient's history were reviewed and updated as appropriate: allergies, current medications, past family history, past medical history, past social history, past surgical history and problem list.   Objective:   Vitals:   12/14/18 1322  BP: 128/84  Pulse: 94  Weight: 131 lb (59.4 kg)    Fetal Status: Fetal Heart Rate (bpm): 156 Fundal Height: 15 cm Movement: Absent     General:  Alert, oriented and cooperative. Patient is in no acute distress.  Skin: Skin is warm and dry. No rash noted.   Cardiovascular: Normal heart rate noted  Respiratory: Normal respiratory effort, no problems with respiration noted  Abdomen: Soft, gravid, appropriate for gestational age.  Pain/Pressure: Absent     Pelvic: Cervical exam deferred        Extremities: Normal range of motion.  Edema: None  Mental Status: Normal mood and affect. Normal behavior. Normal judgment and thought content.   Assessment and Plan:  Pregnancy: G3P2002 at [redacted]w[redacted]d 1. Encounter for supervision of other normal pregnancy in second trimester Has [place] scehduled on 10/8 UTD currently Reviewed plan of care and next visit has option of virtuals if BP cuff received.   2. Previous cesarean section Repeat at 36 wk Desires Dr Hulan Fray to perform. Discussed that provider will be leaving in Jan 2021  3. Cystic fibrosis carrier  4. Asymptomatic bacteriuria TOC neg, no need for repeat unless sx  Preterm labor symptoms and  general obstetric precautions including but not limited to vaginal bleeding, contractions, leaking of fluid and fetal movement were reviewed in detail with the patient. Please refer to After Visit Summary for other counseling recommendations.   Return in about 4 weeks (around 01/11/2019) for Routine prenatal care, Telehealth/Virtual health OB Visit.  Future Appointments  Date Time Provider [place]  01/12/2019  2:00 PM [facility]-MFC [place] 3 [facility]US M[facility]-US    Caren Macadam, MD

## 2018-12-24 DIAGNOSIS — O1092 Unspecified pre-existing hypertension complicating childbirth: Secondary | ICD-10-CM | POA: Diagnosis not present

## 2018-12-24 DIAGNOSIS — O9902 Anemia complicating childbirth: Secondary | ICD-10-CM | POA: Diagnosis not present

## 2018-12-24 DIAGNOSIS — Z141 Cystic fibrosis carrier: Secondary | ICD-10-CM | POA: Diagnosis not present

## 2018-12-24 DIAGNOSIS — Z3A17 17 weeks gestation of pregnancy: Secondary | ICD-10-CM | POA: Diagnosis not present

## 2018-12-24 DIAGNOSIS — O021 Missed abortion: Secondary | ICD-10-CM | POA: Diagnosis not present

## 2018-12-24 DIAGNOSIS — O1002 Pre-existing essential hypertension complicating childbirth: Secondary | ICD-10-CM | POA: Diagnosis not present

## 2018-12-24 DIAGNOSIS — O34211 Maternal care for low transverse scar from previous cesarean delivery: Secondary | ICD-10-CM | POA: Diagnosis not present

## 2018-12-24 DIAGNOSIS — O34219 Maternal care for unspecified type scar from previous cesarean delivery: Secondary | ICD-10-CM | POA: Diagnosis not present

## 2018-12-24 DIAGNOSIS — Z20828 Contact with and (suspected) exposure to other viral communicable diseases: Secondary | ICD-10-CM | POA: Diagnosis not present

## 2018-12-24 DIAGNOSIS — Z22322 Carrier or suspected carrier of Methicillin resistant Staphylococcus aureus: Secondary | ICD-10-CM | POA: Diagnosis not present

## 2018-12-25 HISTORY — PX: DILATION AND CURETTAGE OF UTERUS: SHX78

## 2018-12-27 MED ORDER — FUTURO SOFT CERVICAL COLLAR MISC
600.00 | Status: DC
Start: ? — End: 2018-12-27

## 2018-12-27 MED ORDER — GLUCOSAMINE-CHONDROIT-COLLAGEN PO
100.00 | ORAL | Status: DC
Start: 2018-12-27 — End: 2018-12-27

## 2018-12-27 MED ORDER — QUINERVA 260 MG PO TABS
650.00 | ORAL_TABLET | ORAL | Status: DC
Start: ? — End: 2018-12-27

## 2018-12-27 MED ORDER — RUBIDIUM CHLORIDE POWD
1.00 | Status: DC
Start: 2018-12-28 — End: 2018-12-27

## 2018-12-27 MED ORDER — Medication
15.00 | Status: DC
Start: ? — End: 2018-12-27

## 2018-12-27 MED ORDER — SIMETHICONE 80 MG PO CHEW
80.00 | CHEWABLE_TABLET | ORAL | Status: DC
Start: ? — End: 2018-12-27

## 2018-12-27 MED ORDER — ATARAX 100 MG PO TABS
1.00 | ORAL_TABLET | ORAL | Status: DC
Start: ? — End: 2018-12-27

## 2018-12-27 MED ORDER — HISTAMINE DIHYDROCHLORIDE (LINIMENTS)
1.00 | Status: DC
Start: ? — End: 2018-12-27

## 2018-12-27 MED ORDER — Medication
Status: DC
Start: ? — End: 2018-12-27

## 2019-01-02 ENCOUNTER — Encounter: Payer: Self-pay | Admitting: Obstetrics & Gynecology

## 2019-01-02 ENCOUNTER — Ambulatory Visit (INDEPENDENT_AMBULATORY_CARE_PROVIDER_SITE_OTHER): Payer: BC Managed Care – PPO | Admitting: Obstetrics & Gynecology

## 2019-01-02 ENCOUNTER — Other Ambulatory Visit: Payer: Self-pay

## 2019-01-02 VITALS — BP 138/85 | HR 75 | Wt 124.5 lb

## 2019-01-02 DIAGNOSIS — O039 Complete or unspecified spontaneous abortion without complication: Secondary | ICD-10-CM

## 2019-01-02 DIAGNOSIS — I10 Essential (primary) hypertension: Secondary | ICD-10-CM | POA: Diagnosis not present

## 2019-01-02 DIAGNOSIS — Z8759 Personal history of other complications of pregnancy, childbirth and the puerperium: Secondary | ICD-10-CM | POA: Diagnosis not present

## 2019-01-02 DIAGNOSIS — Z5189 Encounter for other specified aftercare: Secondary | ICD-10-CM

## 2019-01-02 NOTE — Progress Notes (Addendum)
OBSTETRIC OFFICE POSTPARTUM VISIT NOTE  History:   Emily Mack is a 28 y.o. [id number] here today for follow up after recent fetal loss at [redacted] weeks gestation.  Accompanied by her husband. On 12/24/18, she went to outside ultrasound place for gender confirmation, fetus was found not to have a heartbeat.  She went to [facility] ER where fetal demise was confirmed, she was offered induction vs D&E, and opted for misoprostol induction. She delivered on 12/25/18 after a day of being induced, but needed a suction D&C for retained placenta.  No complications. Had a thorough fetal demise workup that was negative (please see notes and lab reports in [place]) but there was concern about CHTN (see below). She has been followed by their Grief Counseling Service, feels that she is doing okay most days, has a lot of support from friends and family.  Postpartum depression score today was 4. She denies any abnormal vaginal discharge, bleeding, pelvic pain or other concerns.    Past Medical History:  Diagnosis Date  . Asymptomatic bacteriuria during pregnancy 07/01/2016   >100,000 e.coli in initial OB urine culture.      Past Surgical History:  Procedure Laterality Date  . CESAREAN SECTION N/A 08/24/2013   Procedure: CESAREAN SECTION;  Surgeon: Emily Filbert, MD;  Location: [facility];  Service: Obstetrics;  Laterality: N/A;  . CESAREAN SECTION N/A 01/17/2017   Procedure: CESAREAN SECTION;  Surgeon: Emily Filbert, MD;  Location: [place];  Service: Obstetrics;  Laterality: N/A;  . DILATION AND CURETTAGE OF UTERUS  12/25/2018   For retained placenta after vaginal delivery of 17 week fetal demise - Done at [facility]  . TYMPANOPLASTY     age 68    The following portions of the patient's history were reviewed and updated as appropriate: allergies, current medications, past family history, past medical history, past social history, past surgical history and problem list.   Health Maintenance:  Normal pap and  negative HRHPV on 11/16/2018.    Review of Systems:  Pertinent items noted in HPI and remainder of comprehensive ROS otherwise negative.  Physical Exam:  BP 138/85   Pulse 75   Wt 124 lb 8 oz (56.5 kg)   LMP 08/26/2018 (Exact Date)   BMI 26.02 kg/m  CONSTITUTIONAL: Well-developed, well-nourished female in no acute distress.  HEENT:  Normocephalic, atraumatic. External right and left ear normal. No scleral icterus.  NECK: Normal range of motion, supple, no masses noted on observation SKIN: No rash noted. Not diaphoretic. No erythema. No pallor. MUSCULOSKELETAL: Normal range of motion. No edema noted. NEUROLOGIC: Alert and oriented to person, place, and time. Normal muscle tone coordination. No cranial nerve deficit noted. PSYCHIATRIC: Normal mood and affect. Normal behavior. Normal judgment and thought content. CARDIOVASCULAR: Normal heart rate noted RESPIRATORY: Effort and breath sounds normal, no problems with respiration noted ABDOMEN: No masses noted. No other overt distention noted.   PELVIC: Deferred  Chronic Hypertension Diagnosis Had elevated BP on two separate occasions 11/16/18 [redacted]w[redacted]d 145/93 12/24/18 [redacted]w[redacted]d 162/94 12/24/18 [redacted]w[redacted]d 161/94 then 146/99 This is thought to have contributed to IUFD at [redacted]w[redacted]d *Date of IUFD diagnosis  Labs and Imaging Refer to CareEverywhere    Assessment and Plan:    1. History of fetal loss 2. Follow-up visit after miscarriage 3. Chronic Hypertension Appropriate support given to patient and her husband. She declined further counselor help for now. Discussed all results, all questions answered. Had a long discussion emphasizing that this was not her fault or  her husband's fault, no clear etiology for now.  She wants to try again soon; encouraged use of ongoing prenatal vitamins. She asked about low dose ASA, she was told she would use this after 12 weeks for prevention of preeclampsia and HTN issues.   She is also interested in possible progestin use  in early pregnancy, will discuss this when she conceives. She was told to call or come in for any concerns. Will follow up with her in 4 weeks.   Return in about 4 weeks (around 01/30/2019) for Virtual Follow Up.     Jaynie Collins, MD, FACOG Obstetrician & Gynecologist, [facility] for [employer], [facility]

## 2019-01-02 NOTE — Patient Instructions (Signed)
Return to clinic for any scheduled appointments or for any gynecologic concerns as needed.   

## 2019-01-10 DIAGNOSIS — I10 Essential (primary) hypertension: Secondary | ICD-10-CM

## 2019-01-11 ENCOUNTER — Telehealth: Payer: BC Managed Care – PPO | Admitting: Family Medicine

## 2019-01-12 ENCOUNTER — Encounter: Payer: Self-pay | Admitting: Obstetrics & Gynecology

## 2019-01-12 ENCOUNTER — Ambulatory Visit (HOSPITAL_COMMUNITY): Payer: BC Managed Care – PPO

## 2019-01-12 DIAGNOSIS — I10 Essential (primary) hypertension: Secondary | ICD-10-CM

## 2019-01-12 HISTORY — DX: Essential (primary) hypertension: I10

## 2019-01-16 ENCOUNTER — Other Ambulatory Visit: Payer: Self-pay

## 2019-01-16 ENCOUNTER — Other Ambulatory Visit (INDEPENDENT_AMBULATORY_CARE_PROVIDER_SITE_OTHER): Payer: BC Managed Care – PPO

## 2019-01-16 VITALS — BP 123/87 | HR 91

## 2019-01-16 DIAGNOSIS — R8271 Bacteriuria: Secondary | ICD-10-CM

## 2019-01-16 NOTE — Progress Notes (Signed)
Pt here for a repeat urine culture. Pt was unaware of previous culture results of a UTI and so meds where not taken. Culture sent out and will call pt with results and if treatment is still needed.    Crosby Oyster, RN

## 2019-01-16 NOTE — Progress Notes (Signed)
Patient seen and assessed by nursing staff.  Agree with documentation and plan.  

## 2019-01-17 LAB — URINE CULTURE

## 2019-01-31 ENCOUNTER — Encounter: Payer: Self-pay | Admitting: Obstetrics & Gynecology

## 2019-01-31 ENCOUNTER — Telehealth (INDEPENDENT_AMBULATORY_CARE_PROVIDER_SITE_OTHER): Payer: BC Managed Care – PPO | Admitting: Obstetrics & Gynecology

## 2019-01-31 ENCOUNTER — Other Ambulatory Visit: Payer: Self-pay

## 2019-01-31 DIAGNOSIS — Z8759 Personal history of other complications of pregnancy, childbirth and the puerperium: Secondary | ICD-10-CM

## 2019-01-31 DIAGNOSIS — I10 Essential (primary) hypertension: Secondary | ICD-10-CM

## 2019-01-31 DIAGNOSIS — O039 Complete or unspecified spontaneous abortion without complication: Secondary | ICD-10-CM

## 2019-01-31 NOTE — Progress Notes (Signed)
TELEHEALTH OBSTETRIC POSTPARTUM VIRTUAL VIDEO VISIT ENCOUNTER NOTE  Provider location: Center for [facility]   I connected with Emily Mack on 01/31/19 at 10:30 AM EDT by MyChart Video Encounter at home and verified that I am speaking with the correct person using two identifiers.   I discussed the limitations, risks, security and privacy concerns of performing an evaluation and management service virtually and the availability of in person appointments. I also discussed with the patient that there may be a patient responsible charge related to this service. The patient expressed understanding and agreed to proceed.   History:  Emily Mack is a 28 y.o. [phone] female being evaluated today for follow up after recent fetal loss at [redacted] weeks gestation. Please see my detailed note on 01/02/19 for more details. Since that visit, she has sbmitted several BP readings via MyChart wondering if she should start medication. They were mostly normal BP readings, she was told that there was no need for medications at this point. She is very nervous about this, wants to optimize everything before her next pregnancy. She has changed her diet, eating a heart-health low sodium diet.  Very interested in ASA during pregnancy and also progesterone supplementation during next pregnancy.  She denies any abnormal vaginal discharge, bleeding, pelvic pain or other concerns. She is doing much better compared to last time, has a lot of family and friend support.       Past Medical History:  Diagnosis Date  . Asymptomatic bacteriuria during pregnancy 07/01/2016   >100,000 e.coli in initial OB urine culture.    . Chronic hypertension 01/12/2019   Diagnosed during recent pregnancy (elevated BP on two separate occasions) 11/16/18 [redacted]w[redacted]d 145/93 12/24/18 [redacted]w[redacted]d 162/94 12/24/18 [redacted]w[redacted]d 161/94 then 146/99 This is thought to have contributed to IUFD at [redacted]w[redacted]d   Past Surgical History:  Procedure Laterality  Date  . CESAREAN SECTION N/A 08/24/2013   Procedure: CESAREAN SECTION;  Surgeon: Allie Bossier, MD;  Location: [facility];  Service: Obstetrics;  Laterality: N/A;  . CESAREAN SECTION N/A 01/17/2017   Procedure: CESAREAN SECTION;  Surgeon: Allie Bossier, MD;  Location: [facility];  Service: Obstetrics;  Laterality: N/A;  . DILATION AND CURETTAGE OF UTERUS  12/25/2018   For retained placenta after vaginal delivery of 17 week fetal demise - Done at [facility]  . TYMPANOPLASTY     age 28   The following portions of the patient's history were reviewed and updated as appropriate: allergies, current medications, past family history, past medical history, past social history, past surgical history and problem list.   Health Maintenance:  Normal pap and negative HRHPV on 11/16/18.     Review of Systems:  Pertinent items noted in HPI and remainder of comprehensive ROS otherwise negative.  Physical Exam:   General:  Alert, oriented and cooperative. Patient appears to be in no acute distress.  Mental Status: Normal mood and affect. Normal behavior. Normal judgment and thought content.   Respiratory: Normal respiratory effort, no problems with respiration noted  Rest of physical exam deferred due to type of encounter      Assessment and Plan:     1. History of fetal loss 2. Follow-up visit after miscarriage 3. Chronic hypertension Doing better overall. Commended on her dietary changes.  Patient just had her period, told to continue PNV or MVI with folic acid as she can ovulate and conceive at any time.  Talked about progesterone supplementation in early pregnancy; and ASA  use after 12 weeks. All questions answered.  Offered that patient may feel better talking to a medicine specialist about her HTN  And may be reassured by the specialist, she agrees.  Appointment will be made for her to discuss her HTN with my partner Dr. Lauretta Chester [facility] Medicine Specialist with concentration in Obstetrics).  I do  not feel she needs maintenance medication now, but will defer to my partner's expertise. Patient encouraged to continue BP checks and can upload them to [facility], Dr. Ernestina Patches will go over the readings and give her recommendations.     I discussed the assessment and treatment plan with the patient. The patient was provided an opportunity to ask questions and all were answered. The patient agreed with the plan and demonstrated an understanding of the instructions.   The patient was advised to call back or seek an in-person evaluation/go to the ED if the symptoms worsen or if the condition fails to improve as anticipated.  I provided 25 minutes of face-to-face time during this encounter.   Verita Schneiders, MD Center for [employer], [place]

## 2019-01-31 NOTE — Patient Instructions (Signed)
Return to clinic for any scheduled appointments or for any gynecologic concerns as needed.   

## 2019-01-31 NOTE — Progress Notes (Signed)
I connected with  Emily Mack on 01/31/19 at 10:30 AM EDT by telephone and verified that I am speaking with the correct person using two identifiers.   I discussed the limitations, risks, security and privacy concerns of performing an evaluation and management service by telephone and the availability of in person appointments. I also discussed with the patient that there may be a patient responsible charge related to this service. The patient expressed understanding and agreed to proceed.  Crosby Oyster, RN 01/31/2019  10:22 AM

## 2019-02-08 ENCOUNTER — Telehealth (INDEPENDENT_AMBULATORY_CARE_PROVIDER_SITE_OTHER): Payer: BC Managed Care – PPO | Admitting: Family Medicine

## 2019-02-08 ENCOUNTER — Encounter: Payer: Self-pay | Admitting: Family Medicine

## 2019-02-08 DIAGNOSIS — I10 Essential (primary) hypertension: Secondary | ICD-10-CM | POA: Diagnosis not present

## 2019-02-08 DIAGNOSIS — Z8759 Personal history of other complications of pregnancy, childbirth and the puerperium: Secondary | ICD-10-CM | POA: Diagnosis not present

## 2019-02-08 HISTORY — DX: Personal history of other complications of pregnancy, childbirth and the puerperium: Z87.59

## 2019-02-08 NOTE — Patient Instructions (Signed)
  [facility]  [email or web address]

## 2019-02-08 NOTE — Progress Notes (Signed)
I connected with  Arvin Collard on 02/08/19 at  9:45 AM EST by telephone and verified that I am speaking with the correct person using two identifiers.   I discussed the limitations, risks, security and privacy concerns of performing an evaluation and management service by telephone and the availability of in person appointments. I also discussed with the patient that there may be a patient responsible charge related to this service. The patient expressed understanding and agreed to proceed.  Crosby Oyster, RN 02/08/2019  10:07 AM

## 2019-02-08 NOTE — Progress Notes (Signed)
TELEHEALTH VIRTUAL GYNECOLOGY VISIT ENCOUNTER NOTE  I connected with Emily Mack on 02/08/19 at  9:45 AM EST by telephone at home and verified that I am speaking with the correct person using two identifiers.   I discussed the limitations, risks, security and privacy concerns of performing an evaluation and management service by telephone and the availability of in person appointments. I also discussed with the patient that there may be a patient responsible charge related to this service. The patient expressed understanding and agreed to proceed.   History:  Emily Mack is a 28 y.o. [phone] female being evaluated today for BP check. She denies any abnormal vaginal discharge, bleeding, pelvic pain or other concerns.     Blood pressure cuff ran out of battery life and patient has not replaced them yet. Reports last BP on machine 10/22 123/89. No HA, blurry visions, CP, SOB.    Past Medical History:  Diagnosis Date  . Asymptomatic bacteriuria during pregnancy 07/01/2016   >100,000 e.coli in initial OB urine culture.    . Chronic hypertension 01/12/2019   Diagnosed during recent pregnancy (elevated BP on two separate occasions) 11/16/18 [redacted]w[redacted]d 145/93 12/24/18 [redacted]w[redacted]d 162/94 12/24/18 [redacted]w[redacted]d 161/94 then 146/99 This is thought to have contributed to IUFD at [redacted]w[redacted]d   Past Surgical History:  Procedure Laterality Date  . CESAREAN SECTION N/A 08/24/2013   Procedure: CESAREAN SECTION;  Surgeon: Allie Bossier, MD;  Location: [facility];  Service: Obstetrics;  Laterality: N/A;  . CESAREAN SECTION N/A 01/17/2017   Procedure: CESAREAN SECTION;  Surgeon: Allie Bossier, MD;  Location: [facility] BIRTH[facility] SU[facility];  Service: Obstetrics;  Laterality: N/A;  . DILATION AND CURETTAGE OF UTERUS  12/25/2018   For retained placenta after vaginal delivery of 17 week fetal demise - Done at [facility]  . TYMPANOPLASTY     age 56   The following portions of the patient's history were reviewed and updated as appropriate: allergies,  current medications, past family history, past medical history, past social history, past surgical history and problem list.   Health Maintenance:  Normal pap and negative HRHPV on 11/16/18.    Review of Systems:  Pertinent items noted in HPI and remainder of comprehensive ROS otherwise negative.  Physical Exam:   There were no vitals taken for this visit.  General:  Alert, oriented and cooperative.   Mental Status: Normal mood and affect perceived. Normal judgment and thought content.  Physical exam deferred due to nature of the encounter  Labs and Imaging No results found for this or any previous visit (from the past 336 hour(s)). No results found.    Assessment and Plan:   1. Chronic hypertension Reviewed BP and is WNL currently Discussed checking monthly BP and calling office for levels >140 SBP or >90 DBP.  Encouraged her current diet changes with decreased sodium intake  2. History of IUFD We discussed her recent trauma at length today. She expressed a desire for another pregnancy and many fears and concerns about future pregnancies. Patient became tearful several times and I provided support. We discussed that often IUFDs have no known cause. Patient discussed her concerns several times and I reassured consistently about not blaming herself for this loss. We reviewed considering NIPT with next pregnancy and maximizing intrapartum health.  I also discussed Heartstrings as I do feel this patient needs support in her grief journey. Consider referral to integrated behavioral health in future for personalized counseling as needed.   >50% of this 40 minute visit was  spent directly discussing patient concerns about health, plans for future pregnancies, review of her pre-conceptional health, motivational interviewing about her fears and concerns.  Additionally we reviewed community resources for pregnancy loss support.  Recommend patient be scheduled for longer appointments during this  acute phase of grief/6 months.    I discussed the assessment and treatment plan with the patient. The patient was provided an opportunity to ask questions and all were answered. The patient agreed with the plan and demonstrated an understanding of the instructions.   The patient was advised to call back or seek an in-person evaluation/go to the ED if the symptoms worsen or if the condition fails to improve as anticipated.  I provided 40 minutes of non-face-to-face time during this encounter.   Caren Macadam, MD Center for [employer], [facility]

## 2020-01-16 ENCOUNTER — Other Ambulatory Visit: Payer: Self-pay | Admitting: *Deleted

## 2020-01-16 DIAGNOSIS — Z3481 Encounter for supervision of other normal pregnancy, first trimester: Secondary | ICD-10-CM

## 2020-01-16 MED ORDER — DOXYLAMINE SUCCINATE (SLEEP) 25 MG PO TABS: 25.0000 mg | ORAL_TABLET | Freq: Every evening | ORAL | 6 refills | Status: DC | PRN

## 2020-01-16 MED ORDER — VITAMIN B-6 25 MG PO TABS: 25.0000 mg | ORAL_TABLET | Freq: Two times a day (BID) | ORAL | 3 refills | Status: DC

## 2020-01-31 ENCOUNTER — Encounter: Payer: Self-pay | Admitting: Advanced Practice Midwife

## 2020-01-31 ENCOUNTER — Other Ambulatory Visit (HOSPITAL_COMMUNITY)
Admission: RE | Admit: 2020-01-31 | Discharge: 2020-01-31 | Disposition: A | Discharge status: Home / Self Care | Payer: BC Managed Care – PPO | Source: Ambulatory Visit | Attending: Advanced Practice Midwife | Admitting: Advanced Practice Midwife

## 2020-01-31 ENCOUNTER — Ambulatory Visit (INDEPENDENT_AMBULATORY_CARE_PROVIDER_SITE_OTHER): Payer: BC Managed Care – PPO | Admitting: Advanced Practice Midwife

## 2020-01-31 ENCOUNTER — Other Ambulatory Visit: Payer: Self-pay

## 2020-01-31 VITALS — BP 118/86 | HR 101 | Wt 135.0 lb

## 2020-01-31 DIAGNOSIS — Z3481 Encounter for supervision of other normal pregnancy, first trimester: Secondary | ICD-10-CM | POA: Diagnosis not present

## 2020-01-31 DIAGNOSIS — Z3480 Encounter for supervision of other normal pregnancy, unspecified trimester: Secondary | ICD-10-CM

## 2020-01-31 DIAGNOSIS — I10 Essential (primary) hypertension: Secondary | ICD-10-CM | POA: Diagnosis not present

## 2020-01-31 DIAGNOSIS — Z3A10 10 weeks gestation of pregnancy: Secondary | ICD-10-CM | POA: Diagnosis not present

## 2020-01-31 DIAGNOSIS — Z8759 Personal history of other complications of pregnancy, childbirth and the puerperium: Secondary | ICD-10-CM

## 2020-01-31 MED ORDER — ASPIRIN EC 81 MG PO TBEC: 81.0000 mg | DELAYED_RELEASE_TABLET | Freq: Every day | ORAL | 2 refills | Status: DC

## 2020-01-31 MED ORDER — PROGESTERONE 200 MG PO CAPS: 400.0000 mg | ORAL_CAPSULE | Freq: Every day | ORAL | 0 refills | Status: DC

## 2020-01-31 NOTE — Progress Notes (Signed)
DATING AND VIABILITY SONOGRAM   Emily Mack is a 29 y.o. year old G8P2012 with LMP Patient's last menstrual period was 11/17/2019. which would correlate to  [redacted]w[redacted]d weeks gestation.  She has regular menstrual cycles.   She is here today for a confirmatory initial sonogram.    GESTATION: SINGLETON yes     FETAL ACTIVITY:          Heart rate         173          The fetus is active.    GESTATIONAL AGE AND  BIOMETRICS:  Gestational criteria: Estimated Date of Delivery: 08/23/20 by LMP now at [redacted]w[redacted]d  Previous Scans:0  GESTATIONAL SAC            mm         weeks  CROWN RUMP LENGTH           4.12 cm         11.0 weeks                                                   AVERAGE EGA(BY THIS SCAN):  11.0 weeks  WORKING EDD( LMP ):  08/23/2020     TECHNICIAN COMMENTS:  Patient informed that the ultrasound is considered a limited obstetric ultrasound and is not intended to be a complete ultrasound exam. Patient also informed that the ultrasound is not being completed with the intent of assessing for fetal or placental anomalies or any pelvic abnormalities. Explained that the purpose of today's ultrasound is to assess for fetal heart rate. Patient acknowledges the purpose of the exam and the limitations of the study.         A copy of this report including all images has been saved and backed up to a second source for retrieval if needed. All measures and details of the anatomical scan, placentation, fluid volume and pelvic anatomy are contained in that report.  Scheryl Marten 01/31/2020 8:23 AM

## 2020-01-31 NOTE — Progress Notes (Signed)
History:   Emily Mack is a 29 y.o. [id number] at [redacted]w[redacted]d by LMP c/w 10 week viability scan performed in office today. She is being seen today for her first obstetrical visit.  Her obstetrical history is significant for Chronic HTN, IUFD. Patient does intend to breast feed. Pregnancy history fully reviewed.   Patient reports no complaints. She is nervous about today's appointment due to recent IUFD. She has questions regarding protocol for Progesterone and bASA. She is accompanied by her husband.     HISTORY: OB History  Gravida Para Term Preterm AB Living  4 2 2  0 1 2  SAB TAB Ectopic Multiple Live Births  1 0 0 0 2    # Outcome Date GA Lbr Len/2nd Weight Sex Delivery Anes PTL Lv  4 Current           3 SAB 12/25/18 [redacted]w[redacted]d    SAB  N FD     Birth Comments: Misoprostol IOL>D&C for retained placenta     Complications: Retained placenta  2 Term 01/17/17 [redacted]w[redacted]d  8 lb (3.63 kg) M CS-LTranv Spinal  LIV     Name: Emily Mack     Apgar1: 9  Apgar5: 9  1 Term 08/24/13 [redacted]w[redacted]d  6 lb 10.9 oz (3.03 kg) M CS-LTranv EPI  LIV     Name: Emily Mack     Apgar1: 9  Apgar5: 9    Last pap smear was done 11/16/2018 and was normal  Past Medical History:  Diagnosis Date  . Asymptomatic bacteriuria during pregnancy 07/01/2016   >100,000 e.coli in initial OB urine culture.    . Chronic hypertension 01/12/2019   Diagnosed during recent pregnancy (elevated BP on two separate occasions) 11/16/18 [redacted]w[redacted]d 145/93 12/24/18 [redacted]w[redacted]d 162/94 12/24/18 [redacted]w[redacted]d 161/94 then 146/99 This is thought to have contributed to IUFD at [redacted]w[redacted]d   Past Surgical History:  Procedure Laterality Date  . CESAREAN SECTION N/A 08/24/2013   Procedure: CESAREAN SECTION;  Surgeon: 08/26/2013, MD;  Location: [facility];  Service: Obstetrics;  Laterality: N/A;  . CESAREAN SECTION N/A 01/17/2017   Procedure: CESAREAN SECTION;  Surgeon: 01/19/2017, MD;  Location: [facility] BIRTHING SUITES;  Service: Obstetrics;  Laterality: N/A;  . DILATION AND  CURETTAGE OF UTERUS  12/25/2018   For retained placenta after vaginal delivery of 17 week fetal demise - Done at [facility]  . TYMPANOPLASTY     age 60   Family History  Problem Relation Age of Onset  . Diabetes Mother   . Hearing loss Mother   . Hearing loss Paternal Uncle   . Hearing loss Paternal Grandfather    Social History   Tobacco Use  . Smoking status: Never Smoker  . Smokeless tobacco: Never Used  Vaping Use  . Vaping Use: Never used  Substance Use Topics  . Alcohol use: No  . Drug use: No   No Known Allergies Current Outpatient Medications on File Prior to Visit  Medication Sig Dispense Refill  . Blood Pressure Monitoring (BLOOD PRESSURE CUFF) MISC 1 Device by Does not apply route once a week. To be monitored weekly at home 1 each 0  . doxylamine, Sleep, (UNISOM) 25 MG tablet Take 1 tablet (25 mg total) by mouth at bedtime as needed. 30 tablet 6  . Prenatal Vit-Fe Fumarate-FA (PRENATAL MULTIVITAMIN) TABS tablet Take 1 tablet by mouth daily.     . vitamin B-6 (PYRIDOXINE) 25 MG tablet Take 1 tablet (25 mg total) by mouth 2 (two) times daily.  60 tablet 3   No current facility-administered medications on file prior to visit.    Review of Systems Pertinent items noted in HPI and remainder of comprehensive ROS otherwise negative. Physical Exam:   Vitals:   01/31/20 0812 01/31/20 0902  BP: (!) 142/87 118/86  Pulse: 90 (!) 101  Weight: 135 lb (61.2 kg)      Bedside Ultrasound for FHR check: Viable intrauterine pregnancy with positive cardiac activity note Patient informed that the ultrasound is considered a limited obstetric ultrasound and is not intended to be a complete ultrasound exam.  Patient also informed that the ultrasound is not being completed with the intent of assessing for fetal or placental anomalies or any pelvic abnormalities.  Explained that the purpose of today's ultrasound is to assess for fetal heart rate.  Patient acknowledges the purpose of the exam  and the limitations of the study. Pelvic Exam: Deferred       System: General: well-developed, well-nourished female in no acute distress   Breasts:  normal appearance   Skin: normal coloration and turgor, no rashes   Neurologic: oriented, normal, negative, normal mood   Extremities: normal strength, tone, and muscle mass, ROM of all joints is normal   HEENT PERRLA, extraocular movement intact and sclera clear, anicteric   Mouth/Teeth mucous membranes moist, pharynx normal without lesions and dental hygiene good   Neck supple and no masses   Cardiovascular: regular rate and rhythm   Respiratory:  no respiratory distress, normal breath sounds   Abdomen: soft, non-tender; bowel sounds normal; no masses,  no organomegaly    Assessment:    Pregnancy: [id number] Patient Active Problem List   Diagnosis Date Noted  . History of IUFD 02/08/2019  . Chronic hypertension 01/12/2019  . Supervision of other normal pregnancy, antepartum 11/16/2018  . MRSA carrier 12/16/2016  . Previous cesarean section 08/21/2016  . Cystic fibrosis carrier 07/01/2016  . Asymptomatic bacteriuria 07/01/2016     Plan:    1. Encounter for supervision of other normal pregnancy in first trimester - No concerning findings on physical exam or reported by patient - CBC/D/Plt+RPR+Rh+ABO+Rub Ab... - Culture, OB Urine - Genetic Screening - GC/Chlamydia probe amp ([place])not at [facility] [place] MFM OB COMP + 14 WK; Future - Comprehensive metabolic panel - TSH - Protein / creatinine ratio, urine - Enroll Patient in Babyscripts  2. Chronic hypertension - Not currently on meds - Mild elevation on arrival, normotensive when rechecked at end of visit - Aspirin EC 81 MG tablet; Take 1 tablet (81 mg total) by mouth daily. Take after 12 weeks for prevention of preeclampsia later in pregnancy (on or after 11/05)  Dispense: 300 tablet; Refill: 2  3. History of IUFD - Ordered per discussion with Dr. Macon Mack - progesterone  (PROMETRIUM) 200 MG capsule; Take 2 capsules (400 mg total) by mouth daily. Take through 16th week of pregnancy (03/07/2020)  Dispense: 120 capsule; Refill: 0   Initial labs drawn. Continue prenatal vitamins. Problem list reviewed and updated. Genetic Screening discussed, First trimester screen, Quad screen and NIPS: requested. Ultrasound discussed; fetal anatomic survey: Viability scan reviewed, will order 19 week aneuploidy scan at next visit. Anticipatory guidance about prenatal visits given including labs, ultrasounds, and testing. Discussed usage of Babyscripts and virtual visits as additional source of managing and completing prenatal visits in midst of coronavirus and pandemic.   Encouraged to complete MyChart Registration for her ability to review results, send requests, and have questions addressed.  The nature of  [facility]Faculty Practice with multiple MDs and Advanced Practice Providers was explained to patient; also emphasized that residents, students are part of our team. Routine obstetric precautions reviewed. Encouraged to seek out care at office or emergency room [facility] preferred) for urgent and/or emergent concerns. Return in about 4 weeks (around 02/28/2020).     Clayton Bibles, MSN, CNM Certified Nurse Midwife, [facility] for [employer], [facility] 01/31/20 11:48 AM

## 2020-01-31 NOTE — Patient Instructions (Signed)
First Trimester of Pregnancy  The first trimester of pregnancy is from week 1 until the end of week 13 (months 1 through 3). During this time, your baby will begin to develop inside you. At 6-8 weeks, the eyes and face are formed, and the heartbeat can be seen on ultrasound. At the end of 12 weeks, all the baby's organs are formed. Prenatal care is all the medical care you receive before the birth of your baby. Make sure you get good prenatal care and follow all of your doctor's instructions. Follow these instructions at home: Medicines  Take over-the-counter and prescription medicines only as told by your doctor. Some medicines are safe and some medicines are not safe during pregnancy.  Take a prenatal vitamin that contains at least 600 micrograms (mcg) of folic acid.  If you have trouble pooping (constipation), take medicine that will make your stool soft (stool softener) if your doctor approves. Eating and drinking   Eat regular, healthy meals.  Your doctor will tell you the amount of weight gain that is right for you.  Avoid raw meat and uncooked cheese.  If you feel sick to your stomach (nauseous) or throw up (vomit): ? Eat 4 or 5 small meals a day instead of 3 large meals. ? Try eating a few soda crackers. ? Drink liquids between meals instead of during meals.  To prevent constipation: ? Eat foods that are high in fiber, like fresh fruits and vegetables, whole grains, and beans. ? Drink enough fluids to keep your pee (urine) clear or pale yellow. Activity  Exercise only as told by your doctor. Stop exercising if you have cramps or pain in your lower belly (abdomen) or low back.  Do not exercise if it is too hot, too humid, or if you are in a place of great height (high altitude).  Try to avoid standing for long periods of time. Move your legs often if you must stand in one place for a long time.  Avoid heavy lifting.  Wear low-heeled shoes. Sit and stand up  straight.  You can have sex unless your doctor tells you not to. Relieving pain and discomfort  Wear a good support bra if your breasts are sore.  Take warm water baths (sitz baths) to soothe pain or discomfort caused by hemorrhoids. Use hemorrhoid cream if your doctor says it is okay.  Rest with your legs raised if you have leg cramps or low back pain.  If you have puffy, bulging veins (varicose veins) in your legs: ? Wear support hose or compression stockings as told by your doctor. ? Raise (elevate) your feet for 15 minutes, 3-4 times a day. ? Limit salt in your food. Prenatal care  Schedule your prenatal visits by the twelfth week of pregnancy.  Write down your questions. Take them to your prenatal visits.  Keep all your prenatal visits as told by your doctor. This is important. Safety  Wear your seat belt at all times when driving.  Make a list of emergency phone numbers. The list should include numbers for family, friends, the hospital, and police and fire departments. General instructions  Ask your doctor for a referral to a local prenatal class. Begin classes no later than at the start of month 6 of your pregnancy.  Ask for help if you need counseling or if you need help with nutrition. Your doctor can give you advice or tell you where to go for help.  Do not use hot tubs, steam   rooms, or saunas.  Do not douche or use tampons or scented sanitary pads.  Do not cross your legs for long periods of time.  Avoid all herbs and alcohol. Avoid drugs that are not approved by your doctor.  Do not use any tobacco products, including cigarettes, chewing tobacco, and electronic cigarettes. If you need help quitting, ask your doctor. You may get counseling or other support to help you quit.  Avoid cat litter boxes and soil used by cats. These carry germs that can cause birth defects in the baby and can cause a loss of your baby (miscarriage) or stillbirth.  Visit your dentist.  At home, brush your teeth with a soft toothbrush. Be gentle when you floss. Contact a doctor if:  You are dizzy.  You have mild cramps or pressure in your lower belly.  You have a nagging pain in your belly area.  You continue to feel sick to your stomach, you throw up, or you have watery poop (diarrhea).  You have a bad smelling fluid coming from your vagina.  You have pain when you pee (urinate).  You have increased puffiness (swelling) in your face, hands, legs, or ankles. Get help right away if:  You have a fever.  You are leaking fluid from your vagina.  You have spotting or bleeding from your vagina.  You have very bad belly cramping or pain.  You gain or lose weight rapidly.  You throw up blood. It may look like coffee grounds.  You are around people who have German measles, fifth disease, or chickenpox.  You have a very bad headache.  You have shortness of breath.  You have any kind of trauma, such as from a fall or a car accident. Summary  The first trimester of pregnancy is from week 1 until the end of week 13 (months 1 through 3).  To take care of yourself and your unborn baby, you will need to eat healthy meals, take medicines only if your doctor tells you to do so, and do activities that are safe for you and your baby.  Keep all follow-up visits as told by your doctor. This is important as your doctor will have to ensure that your baby is healthy and growing well. This information is not intended to replace advice given to you by your health care provider. Make sure you discuss any questions you have with your health care provider. Document Revised: 07/14/2018 Document Reviewed: 03/31/2016 Elsevier Patient Education  2020 Elsevier Inc.  

## 2020-02-01 LAB — COMPREHENSIVE METABOLIC PANEL
ALT: 15 IU/L (ref 0–32)
AST: 13 IU/L (ref 0–40)
Albumin/Globulin Ratio: 1.4 (ref 1.2–2.2)
Albumin: 4.5 g/dL (ref 3.8–4.8)
Alkaline Phosphatase: 57 IU/L (ref 44–121)
BUN/Creatinine Ratio: 15 (ref 9–23)
BUN: 8 mg/dL (ref 6–20)
Bilirubin Total: 0.2 mg/dL (ref 0.0–1.2)
CO2: 20 mmol/L (ref 20–29)
Calcium: 9.9 mg/dL (ref 8.7–10.2)
Chloride: 102 mmol/L (ref 96–106)
Creatinine, Ser: 0.52 mg/dL — ABNORMAL LOW (ref 0.57–1.00)
GFR calc Af Amer: 147 mL/min/{1.73_m2} (ref >59)
GFR calc non Af Amer: 128 mL/min/{1.73_m2} (ref >59)
Globulin, Total: 3.2 g/dL (ref 1.5–4.5)
Glucose: 92 mg/dL (ref 65–99)
Potassium: 4.2 mmol/L (ref 3.5–5.2)
Sodium: 138 mmol/L (ref 134–144)
Total Protein: 7.7 g/dL (ref 6.0–8.5)

## 2020-02-01 LAB — CBC/D/PLT+RPR+RH+ABO+RUB AB...
Antibody Screen: NEGATIVE
Basophils Absolute: 0 10*3/uL (ref 0.0–0.2)
Basos: 0 %
EOS (ABSOLUTE): 0.2 10*3/uL (ref 0.0–0.4)
Eos: 2 %
HCV Ab: 0.3 s/co ratio (ref 0.0–0.9)
HIV Screen 4th Generation wRfx: NONREACTIVE
Hematocrit: 41.5 % (ref 34.0–46.6)
Hemoglobin: 13.7 g/dL (ref 11.1–15.9)
Hepatitis B Surface Ag: NEGATIVE
Immature Grans (Abs): 0 10*3/uL (ref 0.0–0.1)
Immature Granulocytes: 0 %
Lymphocytes Absolute: 1.6 10*3/uL (ref 0.7–3.1)
Lymphs: 13 %
MCH: 29 pg (ref 26.6–33.0)
MCHC: 33 g/dL (ref 31.5–35.7)
MCV: 88 fL (ref 79–97)
Monocytes Absolute: 0.7 10*3/uL (ref 0.1–0.9)
Monocytes: 5 %
Neutrophils Absolute: 10.4 10*3/uL — ABNORMAL HIGH (ref 1.4–7.0)
Neutrophils: 80 %
Platelets: 310 10*3/uL (ref 150–450)
RBC: 4.72 x10E6/uL (ref 3.77–5.28)
RDW: 12 % (ref 11.7–15.4)
RPR Ser Ql: NONREACTIVE
Rh Factor: POSITIVE
Rubella Antibodies, IGG: 1.77 index (ref >0.99)
WBC: 13 10*3/uL — ABNORMAL HIGH (ref 3.4–10.8)

## 2020-02-01 LAB — PROTEIN / CREATININE RATIO, URINE
Creatinine, Urine: 146 mg/dL
Protein, Ur: 16.3 mg/dL
Protein/Creat Ratio: 112 mg/g creat (ref 0–200)

## 2020-02-01 LAB — GC/CHLAMYDIA PROBE AMP (~~LOC~~) NOT AT ARMC
Chlamydia: NEGATIVE
Comment: NEGATIVE
Comment: NORMAL
Neisseria Gonorrhea: NEGATIVE

## 2020-02-01 LAB — TSH: TSH: 0.529 u[IU]/mL (ref 0.450–4.500)

## 2020-02-01 LAB — HCV INTERPRETATION

## 2020-02-03 LAB — CULTURE, OB URINE

## 2020-02-03 LAB — URINE CULTURE, OB REFLEX

## 2020-02-05 ENCOUNTER — Telehealth: Payer: Self-pay | Admitting: Radiology

## 2020-02-05 NOTE — Telephone Encounter (Signed)
Informed patient of ultrasound time and date at [facility]

## 2020-02-09 ENCOUNTER — Telehealth: Payer: Self-pay | Admitting: Radiology

## 2020-02-09 ENCOUNTER — Encounter: Payer: Self-pay | Admitting: Radiology

## 2020-02-09 NOTE — Telephone Encounter (Signed)
Patient was informed of Panorama results including fetal sex 

## 2020-02-27 ENCOUNTER — Ambulatory Visit (INDEPENDENT_AMBULATORY_CARE_PROVIDER_SITE_OTHER): Payer: BC Managed Care – PPO | Admitting: Obstetrics & Gynecology

## 2020-02-27 ENCOUNTER — Other Ambulatory Visit: Payer: Self-pay

## 2020-02-27 VITALS — BP 114/77 | HR 91 | Wt 137.0 lb

## 2020-02-27 DIAGNOSIS — Z8759 Personal history of other complications of pregnancy, childbirth and the puerperium: Secondary | ICD-10-CM

## 2020-02-27 DIAGNOSIS — O0992 Supervision of high risk pregnancy, unspecified, second trimester: Secondary | ICD-10-CM

## 2020-02-27 DIAGNOSIS — I10 Essential (primary) hypertension: Secondary | ICD-10-CM

## 2020-02-27 DIAGNOSIS — Z3A14 14 weeks gestation of pregnancy: Secondary | ICD-10-CM

## 2020-02-27 NOTE — Progress Notes (Signed)
   PRENATAL VISIT NOTE  Subjective:  Emily Mack is a 29 y.o. [id number] at [redacted]w[redacted]d being seen today for ongoing prenatal care.  She is currently monitored for the following issues for this high-risk pregnancy and has Cystic fibrosis carrier; Asymptomatic bacteriuria; Previous cesarean section; MRSA carrier; Supervision of high-risk pregnancy; Chronic hypertension; and History of IUFD at 17 weeks on their problem list.  Patient reports no complaints.  Contractions: Not present. Vag. Bleeding: None.   . Denies leaking of fluid.   The following portions of the patient's history were reviewed and updated as appropriate: allergies, current medications, past family history, past medical history, past social history, past surgical history and problem list.   Objective:   Vitals:   02/27/20 0943  BP: 114/77  Pulse: 91  Weight: 137 lb (62.1 kg)    Fetal Status:           General:  Alert, oriented and cooperative. Patient is in no acute distress.  Skin: Skin is warm and dry. No rash noted.   Cardiovascular: Normal heart rate noted  Respiratory: Normal respiratory effort, no problems with respiration noted  Abdomen: Soft, gravid, appropriate for gestational age.        Pelvic: Cervical exam deferred        Extremities: Normal range of motion.     Mental Status: Normal mood and affect. Normal behavior. Normal judgment and thought content.   Assessment and Plan:  Pregnancy: [id number] at [redacted]w[redacted]d 1. [redacted] weeks gestation of pregnancy 2. History of IUFD at 17 weeks 3. Chronic hypertension 4. Supervision of high risk pregnancy in second trimester Patient was nervous, had fetal demise around 17 weeks. BP normal today. Reassured about fetal status with bedside scan.  Anatomy scan already scheduled at 19 weeks. Continue ASA, progesterone pills. Declines flu and COVID vaccines.  No other complaints or concerns.  Routine obstetric precautions reviewed. Please refer to After Visit Summary for other  counseling recommendations.   Return in about 2 weeks (around 03/12/2020) for OFFICE OB VISIT (MD only), AFP labs.  Future Appointments  Date Time Provider Department Center  03/14/2020  1:15 PM  Bing, MD [facility]  04/04/2020  9:00 AM [facility]    Jaynie Collins, MD

## 2020-02-27 NOTE — Patient Instructions (Signed)

## 2020-03-13 ENCOUNTER — Ambulatory Visit (INDEPENDENT_AMBULATORY_CARE_PROVIDER_SITE_OTHER): Payer: BC Managed Care – PPO | Admitting: Advanced Practice Midwife

## 2020-03-13 ENCOUNTER — Encounter: Payer: BC Managed Care – PPO | Admitting: Advanced Practice Midwife

## 2020-03-13 ENCOUNTER — Other Ambulatory Visit: Payer: Self-pay

## 2020-03-13 VITALS — BP 120/82 | HR 94 | Wt 140.2 lb

## 2020-03-13 DIAGNOSIS — O0992 Supervision of high risk pregnancy, unspecified, second trimester: Secondary | ICD-10-CM

## 2020-03-13 DIAGNOSIS — Z3A16 16 weeks gestation of pregnancy: Secondary | ICD-10-CM

## 2020-03-13 DIAGNOSIS — Z8759 Personal history of other complications of pregnancy, childbirth and the puerperium: Secondary | ICD-10-CM

## 2020-03-13 MED ORDER — PROGESTERONE 200 MG PO CAPS: 400.0000 mg | ORAL_CAPSULE | Freq: Every day | ORAL | 0 refills | Status: DC

## 2020-03-13 NOTE — Patient Instructions (Signed)

## 2020-03-13 NOTE — Progress Notes (Signed)
   PRENATAL VISIT NOTE  Subjective:  Emily Mack is a 29 y.o. [id number] at [redacted]w[redacted]d being seen today for ongoing prenatal care.  She is currently monitored for the following issues for this high-risk pregnancy and has Cystic fibrosis carrier; Asymptomatic bacteriuria; Previous cesarean section; MRSA carrier; Supervision of high-risk pregnancy; Chronic hypertension; and History of IUFD at 17 weeks on their problem list.  Patient reports no complaints. She is feeling intermittent anxiety related to GA of [redacted] weeks as she has hx of IUFD at 17 weeks.  Contractions: Not present. Vag. Bleeding: None. Denies leaking of fluid.   The following portions of the patient's history were reviewed and updated as appropriate: allergies, current medications, past family history, past medical history, past social history, past surgical history and problem list. Problem list updated.  Objective:   Vitals:   03/13/20 1333  BP: 120/82  Pulse: 94  Weight: 140 lb 3.2 oz (63.6 kg)    Fetal Status: Fetal Heart Rate (bpm): 143         General:  Alert, oriented and cooperative. Patient is in no acute distress.  Skin: Skin is warm and dry. No rash noted.   Cardiovascular: Normal heart rate noted  Respiratory: Normal respiratory effort, no problems with respiration noted  Abdomen: Soft, gravid, appropriate for gestational age.  Pain/Pressure: Present     Pelvic: Cervical exam deferred        Extremities: Normal range of motion.     Mental Status: Normal mood and affect. Normal behavior. Normal judgment and thought content.   Assessment and Plan:  Pregnancy: [id number] at [redacted]w[redacted]d  1. Supervision of high risk pregnancy in second trimester - No concerns - AFP, Serum, Open Spina Bifida  2. History of IUFD - Per patient preference, normal informal bedside ultrasound today - Prometrium refilled per patient preference and previous conversation with Dr. Macon Large - progesterone (PROMETRIUM) 200 MG capsule; Take 2  capsules (400 mg total) by mouth daily. Take through 16th week of pregnancy (03/07/2020)  Dispense: 120 capsule; Refill: 0  Preterm labor symptoms and general obstetric precautions including but not limited to vaginal bleeding, contractions, leaking of fluid and fetal movement were reviewed in detail with the patient. Please refer to After Visit Summary for other counseling recommendations.  Return in about 4 weeks (around 04/10/2020).  Future Appointments  Date Time Provider Department Center  04/04/2020  9:00 AM [facility]  04/10/2020  8:50 AM Calvert Cantor, CNM [facility]    Calvert Cantor, [place]

## 2020-03-14 ENCOUNTER — Encounter: Payer: BC Managed Care – PPO | Admitting: Obstetrics and Gynecology

## 2020-03-15 LAB — AFP, SERUM, OPEN SPINA BIFIDA
AFP MoM: 1.08
AFP Value: 38.1 ng/mL
Gest. Age on Collection Date: 16 weeks
Maternal Age At EDD: 31.7 yr
OSBR Risk 1 IN: 9540
Test Results:: NEGATIVE
Weight: 140 [lb_av]

## 2020-03-26 ENCOUNTER — Ambulatory Visit (INDEPENDENT_AMBULATORY_CARE_PROVIDER_SITE_OTHER): Payer: BC Managed Care – PPO | Admitting: *Deleted

## 2020-03-26 ENCOUNTER — Other Ambulatory Visit: Payer: Self-pay

## 2020-03-26 VITALS — BP 133/83 | HR 99

## 2020-03-26 DIAGNOSIS — O0992 Supervision of high risk pregnancy, unspecified, second trimester: Secondary | ICD-10-CM

## 2020-03-26 NOTE — Progress Notes (Signed)
Pt in today to check a FHR for reassurance.   FHR 147 via doppler

## 2020-03-26 NOTE — Progress Notes (Signed)
Patient was assessed and managed by nursing staff during this encounter. I have reviewed the chart and agree with the documentation and plan. I have also made any necessary editorial changes.  Jaynie Collins, MD 03/26/2020 2:24 PM

## 2020-04-01 ENCOUNTER — Telehealth: Payer: Self-pay | Admitting: Obstetrics and Gynecology

## 2020-04-01 NOTE — Telephone Encounter (Signed)
I spoke with Ms. Kwasny in preparation for her visit to [facility] on Thursday, 12.30.21.  She was found to be a carrier of cystic fibrosis (CF) in 2018.  At that time, she said that she spoke with someone and her partner was tested and was negative.  This pregnancy is with the same partner.  She did say she recently learned of a cousin with CF that she was never aware of before her results came out.  She said she has no additional questions about CF. We reviewed that the carrier screening cannot detect all carriers or eliminate the chance to have a child with this condition, but that negative results on her partner would greatly reduce that chance.  Newborn screening will also include CF.  In addition, she had a 17 week IUFD in 2020 with delivery at [facility].  No genetic test results were seen in [facility].  Screening for clotting disorders were performed at [facility], with negative results.  The patient stated that her understanding was the loss may have been related to her chronic hypertension.    She stated that she had no questions regarding genetic conditions or testing.  NIPS and AFP only in this pregnancy were normal.  Cherly Anderson, MS, CGC

## 2020-04-04 ENCOUNTER — Other Ambulatory Visit: Payer: Self-pay

## 2020-04-04 ENCOUNTER — Ambulatory Visit: Payer: BC Managed Care – PPO | Attending: Maternal & Fetal Medicine

## 2020-04-04 DIAGNOSIS — Z3A19 19 weeks gestation of pregnancy: Secondary | ICD-10-CM | POA: Diagnosis not present

## 2020-04-04 DIAGNOSIS — Z3482 Encounter for supervision of other normal pregnancy, second trimester: Secondary | ICD-10-CM | POA: Insufficient documentation

## 2020-04-04 DIAGNOSIS — Z3481 Encounter for supervision of other normal pregnancy, first trimester: Secondary | ICD-10-CM | POA: Diagnosis not present

## 2020-04-04 DIAGNOSIS — O350XX0 Maternal care for (suspected) central nervous system malformation in fetus, not applicable or unspecified: Secondary | ICD-10-CM | POA: Diagnosis not present

## 2020-04-04 DIAGNOSIS — Z8759 Personal history of other complications of pregnancy, childbirth and the puerperium: Secondary | ICD-10-CM

## 2020-04-04 DIAGNOSIS — O09292 Supervision of pregnancy with other poor reproductive or obstetric history, second trimester: Secondary | ICD-10-CM

## 2020-04-04 NOTE — Progress Notes (Unsigned)
Recheck of BP @ [facility] appt

## 2020-04-10 ENCOUNTER — Ambulatory Visit (INDEPENDENT_AMBULATORY_CARE_PROVIDER_SITE_OTHER): Payer: BC Managed Care – PPO | Admitting: Advanced Practice Midwife

## 2020-04-10 ENCOUNTER — Other Ambulatory Visit: Payer: Self-pay

## 2020-04-10 VITALS — BP 124/84 | HR 105 | Wt 146.0 lb

## 2020-04-10 DIAGNOSIS — Z8759 Personal history of other complications of pregnancy, childbirth and the puerperium: Secondary | ICD-10-CM

## 2020-04-10 DIAGNOSIS — Z3A20 20 weeks gestation of pregnancy: Secondary | ICD-10-CM

## 2020-04-10 DIAGNOSIS — O0992 Supervision of high risk pregnancy, unspecified, second trimester: Secondary | ICD-10-CM

## 2020-04-10 DIAGNOSIS — I10 Essential (primary) hypertension: Secondary | ICD-10-CM

## 2020-04-10 NOTE — Patient Instructions (Signed)

## 2020-04-10 NOTE — Progress Notes (Signed)
   PRENATAL VISIT NOTE  Subjective:  Emily Mack is a 30 y.o. [id number] at 104w5d being seen today for ongoing prenatal care.  She is currently monitored for the following issues for this high-risk pregnancy and has Cystic fibrosis carrier; Asymptomatic bacteriuria; Previous cesarean section; MRSA carrier; Supervision of high-risk pregnancy; Chronic hypertension; and History of IUFD at 17 weeks on their problem list.  Patient reports no complaints. She is looking forward to planning baby shower and postpartum visits with family. Feeling excited and joyful. Also protective and putting family members on notice that she will not have any visitors until baby is at least one month old.  Contractions: Not present. Vag. Bleeding: None.  Movement: Present. Denies leaking of fluid.   The following portions of the patient's history were reviewed and updated as appropriate: allergies, current medications, past family history, past medical history, past social history, past surgical history and problem list. Problem list updated.  Objective:   Vitals:   04/10/20 0919  BP: 124/84  Pulse: (!) 105  Weight: 146 lb (66.2 kg)    Fetal Status: Fetal Heart Rate (bpm): 154   Movement: Present     General:  Alert, oriented and cooperative. Patient is in no acute distress.  Skin: Skin is warm and dry. No rash noted.   Cardiovascular: Normal heart rate noted  Respiratory: Normal respiratory effort, no problems with respiration noted  Abdomen: Soft, gravid, appropriate for gestational age.  Pain/Pressure: Absent     Pelvic: Cervical exam deferred        Extremities: Normal range of motion.  Edema: None  Mental Status: Normal mood and affect. Normal behavior. Normal judgment and thought content.   Assessment and Plan:  Pregnancy: [id number] at [redacted]w[redacted]d  1. Supervision of high risk pregnancy in second trimester - Routine care, so happy patient is feeling positive and excited about pregnancy - Discussed CDC  recommendations for masking, limiting exposure for pregnant women  2. History of IUFD   3. Chronic hypertension - Not on meds - Normotensive today   4. [redacted] weeks gestation of pregnancy   Preterm labor symptoms and general obstetric precautions including but not limited to vaginal bleeding, contractions, leaking of fluid and fetal movement were reviewed in detail with the patient. Please refer to After Visit Summary for other counseling recommendations.  Return in about 4 weeks (around 05/08/2020).  Future Appointments  Date Time Provider Department Center  05/02/2020 10:00 AM [facility]  05/08/2020  9:30 AM Calvert Cantor, CNM [facility]    Calvert Cantor, [place]

## 2020-04-30 ENCOUNTER — Other Ambulatory Visit: Payer: Self-pay | Admitting: Advanced Practice Midwife

## 2020-04-30 DIAGNOSIS — G930 Cerebral cysts: Secondary | ICD-10-CM

## 2020-04-30 DIAGNOSIS — Z98891 History of uterine scar from previous surgery: Secondary | ICD-10-CM

## 2020-05-02 ENCOUNTER — Ambulatory Visit: Payer: BC Managed Care – PPO | Attending: Obstetrics

## 2020-05-02 ENCOUNTER — Other Ambulatory Visit: Payer: Self-pay

## 2020-05-02 DIAGNOSIS — O34219 Maternal care for unspecified type scar from previous cesarean delivery: Secondary | ICD-10-CM

## 2020-05-02 DIAGNOSIS — G930 Cerebral cysts: Secondary | ICD-10-CM

## 2020-05-02 DIAGNOSIS — Z3A23 23 weeks gestation of pregnancy: Secondary | ICD-10-CM

## 2020-05-02 DIAGNOSIS — O350XX0 Maternal care for (suspected) central nervous system malformation in fetus, not applicable or unspecified: Secondary | ICD-10-CM

## 2020-05-02 DIAGNOSIS — Z8759 Personal history of other complications of pregnancy, childbirth and the puerperium: Secondary | ICD-10-CM

## 2020-05-02 DIAGNOSIS — O283 Abnormal ultrasonic finding on antenatal screening of mother: Secondary | ICD-10-CM | POA: Diagnosis not present

## 2020-05-02 DIAGNOSIS — Z98891 History of uterine scar from previous surgery: Secondary | ICD-10-CM | POA: Diagnosis not present

## 2020-05-08 ENCOUNTER — Other Ambulatory Visit: Payer: Self-pay

## 2020-05-08 ENCOUNTER — Ambulatory Visit (INDEPENDENT_AMBULATORY_CARE_PROVIDER_SITE_OTHER): Payer: BC Managed Care – PPO | Admitting: Advanced Practice Midwife

## 2020-05-08 ENCOUNTER — Encounter: Payer: Self-pay | Admitting: Advanced Practice Midwife

## 2020-05-08 VITALS — BP 134/84 | HR 101 | Wt 152.6 lb

## 2020-05-08 DIAGNOSIS — I10 Essential (primary) hypertension: Secondary | ICD-10-CM

## 2020-05-08 DIAGNOSIS — O0992 Supervision of high risk pregnancy, unspecified, second trimester: Secondary | ICD-10-CM

## 2020-05-08 DIAGNOSIS — O3503X0 Maternal care for (suspected) central nervous system malformation or damage in fetus, choroid plexus cysts, not applicable or unspecified: Secondary | ICD-10-CM

## 2020-05-08 DIAGNOSIS — O350XX0 Maternal care for (suspected) central nervous system malformation in fetus, not applicable or unspecified: Secondary | ICD-10-CM

## 2020-05-08 DIAGNOSIS — Z3A24 24 weeks gestation of pregnancy: Secondary | ICD-10-CM

## 2020-05-08 NOTE — Patient Instructions (Signed)

## 2020-05-08 NOTE — Progress Notes (Signed)
   PRENATAL VISIT NOTE  Subjective:  Emily Mack is a 30 y.o. [id number] at [redacted]w[redacted]d being seen today for ongoing prenatal care.  She is currently monitored for the following issues for this high-risk pregnancy and has Cystic fibrosis carrier; Previous cesarean section; MRSA carrier; Supervision of high-risk pregnancy; Chronic hypertension; and History of IUFD at 17 weeks on their problem list.  Patient reports no complaints.  Contractions: Not present. Vag. Bleeding: None.  Movement: Present. Denies leaking of fluid.   The following portions of the patient's history were reviewed and updated as appropriate: allergies, current medications, past family history, past medical history, past social history, past surgical history and problem list. Problem list updated.  Objective:   Vitals:   05/08/20 0936  BP: 134/84  Pulse: (!) 101  Weight: 152 lb 9.6 oz (69.2 kg)    Fetal Status: Fetal Heart Rate (bpm): 146   Movement: Present     General:  Alert, oriented and cooperative. Patient is in no acute distress.  Skin: Skin is warm and dry. No rash noted.   Cardiovascular: Normal heart rate noted  Respiratory: Normal respiratory effort, no problems with respiration noted  Abdomen: Soft, gravid, appropriate for gestational age.  Pain/Pressure: Absent     Pelvic: Cervical exam deferred        Extremities: Normal range of motion.     Mental Status: Normal mood and affect. Normal behavior. Normal judgment and thought content.   Assessment and Plan:  Pregnancy: [id number] at [redacted]w[redacted]d  1. Supervision of high risk pregnancy in second trimester - No acute concerns - Fasting GTT next visit - For repeat cesarean, MD next visit  2. Chronic hypertension - Not on meds, normotensive  3. Choroid plexus cyst of fetus affecting care of mother, antepartum, single or unspecified fetus - Resolved per MFM  4. [redacted] weeks gestation of pregnancy   Preterm labor symptoms and general obstetric precautions  including but not limited to vaginal bleeding, contractions, leaking of fluid and fetal movement were reviewed in detail with the patient. Please refer to After Visit Summary for other counseling recommendations.  No follow-ups on file.  Future Appointments  Date Time Provider Department Center  06/05/2020  8:30 AM Calvert Cantor, CNM [facility]    Calvert Cantor, [place]

## 2020-06-04 ENCOUNTER — Ambulatory Visit (INDEPENDENT_AMBULATORY_CARE_PROVIDER_SITE_OTHER): Payer: Commercial Managed Care - PPO | Admitting: Obstetrics & Gynecology

## 2020-06-04 ENCOUNTER — Encounter: Payer: Self-pay | Admitting: Obstetrics & Gynecology

## 2020-06-04 ENCOUNTER — Other Ambulatory Visit: Payer: Self-pay

## 2020-06-04 VITALS — BP 115/74 | HR 71 | Wt 155.8 lb

## 2020-06-04 DIAGNOSIS — O99013 Anemia complicating pregnancy, third trimester: Secondary | ICD-10-CM

## 2020-06-04 DIAGNOSIS — Z98891 History of uterine scar from previous surgery: Secondary | ICD-10-CM

## 2020-06-04 DIAGNOSIS — O10913 Unspecified pre-existing hypertension complicating pregnancy, third trimester: Secondary | ICD-10-CM

## 2020-06-04 DIAGNOSIS — Z8759 Personal history of other complications of pregnancy, childbirth and the puerperium: Secondary | ICD-10-CM

## 2020-06-04 DIAGNOSIS — O0992 Supervision of high risk pregnancy, unspecified, second trimester: Secondary | ICD-10-CM

## 2020-06-04 DIAGNOSIS — Z3A28 28 weeks gestation of pregnancy: Secondary | ICD-10-CM

## 2020-06-04 MED ORDER — PROGESTERONE 200 MG PO CAPS: 400.0000 mg | ORAL_CAPSULE | Freq: Every day | ORAL | 2 refills | Status: DC

## 2020-06-04 NOTE — Patient Instructions (Signed)

## 2020-06-04 NOTE — Progress Notes (Signed)
   PRENATAL VISIT NOTE  Subjective:  Emily Mack is a 30 y.o. [id number] at 34w4dbeing seen today for ongoing prenatal care.  She is currently monitored for the following issues for this high-risk pregnancy and has Cystic fibrosis carrier; Previous cesarean section; MRSA carrier; Supervision of high-risk pregnancy; Chronic hypertension; and History of IUFD at 17 weeks on their problem list.  Patient reports feeling "rough" since stopping progesterone last week, desires refill. Declines Tdap Vaccine.  Contractions: Irritability. Vag. Bleeding: None.  Movement: Present. Denies leaking of fluid.   The following portions of the patient's history were reviewed and updated as appropriate: allergies, current medications, past family history, past medical history, past social history, past surgical history and problem list.   Objective:   Vitals:   06/04/20 0947  BP: 115/74  Pulse: 71  Weight: 155 lb 12.8 oz (70.7 kg)    Fetal Status: Fetal Heart Rate (bpm): 152   Movement: Present     General:  Alert, oriented and cooperative. Patient is in no acute distress.  Skin: Skin is warm and dry. No rash noted.   Cardiovascular: Normal heart rate noted  Respiratory: Normal respiratory effort, no problems with respiration noted  Abdomen: Soft, gravid, appropriate for gestational age.  Pain/Pressure: Absent     Pelvic: Cervical exam deferred        Extremities: Normal range of motion.  Edema: None  Mental Status: Normal mood and affect. Normal behavior. Normal judgment and thought content.   Assessment and Plan:  Pregnancy: [id number] 240w4d. Preexisting hypertension complicating pregnancy, antepartum, third trimester Stable BP, no antihypertensives. Continue ASA. Growth scan ordered, will also need others at 32 and 36 weeks. Surveillance labs done today. - Comp Met (CMET) - Protein / creatinine ratio, urine - [place] OB FOLLOW UP; Future  2. Previous cesarean section Desires RCS  3. [redacted]  weeks gestation of pregnancy 4. Supervision of high risk pregnancy in second trimester Third trimester labs done today, will follow up results and manage accordingly. - Glucose Tolerance, 2 Hours w/1 Hour - CBC - RPR - HIV Antibody (routine testing w rflx) Preterm labor symptoms and general obstetric precautions including but not limited to vaginal bleeding, contractions, leaking of fluid and fetal movement were reviewed in detail with the patient. Please refer to After Visit Summary for other counseling recommendations.   Return in about 2 weeks (around 06/18/2020) for OFFICE OB VISIT (MD only).  Future Appointments  Date Time Provider De[place]3/14/2022  9:15 AM [facility] NURSE [facility]3/14/2022  9:30 [facility]  2:30 PM NeCaren MacadamMD [facility]  07/09/2020  8:30 AM Anyanwu, UgSallyanne HaversMD [facility]  07/23/2020  8:30 AM PrDonnamae JudeMD [facility]    UgVerita SchneidersMD

## 2020-06-05 ENCOUNTER — Encounter: Payer: BC Managed Care – PPO | Admitting: Advanced Practice Midwife

## 2020-06-05 LAB — PROTEIN / CREATININE RATIO, URINE
Creatinine, Urine: 107 mg/dL
Protein, Ur: 13.3 mg/dL
Protein/Creat Ratio: 124 mg/g creat (ref 0–200)

## 2020-06-05 LAB — COMPREHENSIVE METABOLIC PANEL
ALT: 10 IU/L (ref 0–32)
AST: 13 IU/L (ref 0–40)
Albumin/Globulin Ratio: 1.3 (ref 1.2–2.2)
Albumin: 3.6 g/dL — ABNORMAL LOW (ref 3.8–4.8)
Alkaline Phosphatase: 98 IU/L (ref 44–121)
BUN/Creatinine Ratio: 13 (ref 9–23)
BUN: 7 mg/dL (ref 6–20)
Bilirubin Total: <0.2 mg/dL (ref 0.0–1.2)
CO2: 17 mmol/L — ABNORMAL LOW (ref 20–29)
Calcium: 8.7 mg/dL (ref 8.7–10.2)
Chloride: 102 mmol/L (ref 96–106)
Creatinine, Ser: 0.54 mg/dL — ABNORMAL LOW (ref 0.57–1.00)
Globulin, Total: 2.8 g/dL (ref 1.5–4.5)
Glucose: 114 mg/dL — ABNORMAL HIGH (ref 65–99)
Potassium: 4.2 mmol/L (ref 3.5–5.2)
Sodium: 136 mmol/L (ref 134–144)
Total Protein: 6.4 g/dL (ref 6.0–8.5)
eGFR: 126 mL/min/{1.73_m2} (ref >59)

## 2020-06-05 LAB — CBC
Hematocrit: 32.9 % — ABNORMAL LOW (ref 34.0–46.6)
Hemoglobin: 10.8 g/dL — ABNORMAL LOW (ref 11.1–15.9)
MCH: 28.7 pg (ref 26.6–33.0)
MCHC: 32.8 g/dL (ref 31.5–35.7)
MCV: 88 fL (ref 79–97)
Platelets: 285 10*3/uL (ref 150–450)
RBC: 3.76 x10E6/uL — ABNORMAL LOW (ref 3.77–5.28)
RDW: 12.4 % (ref 11.7–15.4)
WBC: 11.8 10*3/uL — ABNORMAL HIGH (ref 3.4–10.8)

## 2020-06-05 LAB — HIV ANTIBODY (ROUTINE TESTING W REFLEX): HIV Screen 4th Generation wRfx: NONREACTIVE

## 2020-06-05 LAB — GLUCOSE TOLERANCE, 2 HOURS W/ 1HR
Glucose, 1 hour: 153 mg/dL (ref 65–179)
Glucose, 2 hour: 113 mg/dL (ref 65–152)
Glucose, Fasting: 88 mg/dL (ref 65–91)

## 2020-06-05 LAB — RPR: RPR Ser Ql: NONREACTIVE

## 2020-06-06 ENCOUNTER — Encounter: Payer: Self-pay | Admitting: Obstetrics & Gynecology

## 2020-06-06 DIAGNOSIS — O99013 Anemia complicating pregnancy, third trimester: Secondary | ICD-10-CM | POA: Insufficient documentation

## 2020-06-06 MED ORDER — FERROUS SULFATE 325 (65 FE) MG PO TABS: 325.0000 mg | ORAL_TABLET | ORAL | 3 refills | Status: DC

## 2020-06-06 NOTE — Addendum Note (Signed)
Addended by: Jaynie Collins A on: 06/06/2020 11:26 AM   Modules accepted: Orders

## 2020-06-17 ENCOUNTER — Ambulatory Visit: Payer: Commercial Managed Care - PPO | Attending: Obstetrics & Gynecology

## 2020-06-17 ENCOUNTER — Other Ambulatory Visit: Payer: Self-pay | Admitting: *Deleted

## 2020-06-17 ENCOUNTER — Other Ambulatory Visit: Payer: Self-pay

## 2020-06-17 ENCOUNTER — Ambulatory Visit: Payer: Commercial Managed Care - PPO | Admitting: *Deleted

## 2020-06-17 ENCOUNTER — Encounter: Payer: Self-pay | Admitting: *Deleted

## 2020-06-17 DIAGNOSIS — Z8759 Personal history of other complications of pregnancy, childbirth and the puerperium: Secondary | ICD-10-CM

## 2020-06-17 DIAGNOSIS — O10913 Unspecified pre-existing hypertension complicating pregnancy, third trimester: Secondary | ICD-10-CM | POA: Diagnosis not present

## 2020-06-17 DIAGNOSIS — Z3A28 28 weeks gestation of pregnancy: Secondary | ICD-10-CM | POA: Insufficient documentation

## 2020-06-17 DIAGNOSIS — O99013 Anemia complicating pregnancy, third trimester: Secondary | ICD-10-CM

## 2020-06-24 ENCOUNTER — Other Ambulatory Visit: Payer: Self-pay

## 2020-06-24 ENCOUNTER — Ambulatory Visit (INDEPENDENT_AMBULATORY_CARE_PROVIDER_SITE_OTHER): Payer: Commercial Managed Care - PPO | Admitting: Family Medicine

## 2020-06-24 VITALS — BP 123/83 | HR 132 | Wt 156.0 lb

## 2020-06-24 DIAGNOSIS — O99013 Anemia complicating pregnancy, third trimester: Secondary | ICD-10-CM

## 2020-06-24 DIAGNOSIS — Z98891 History of uterine scar from previous surgery: Secondary | ICD-10-CM

## 2020-06-24 DIAGNOSIS — O0993 Supervision of high risk pregnancy, unspecified, third trimester: Secondary | ICD-10-CM

## 2020-06-24 DIAGNOSIS — Z8759 Personal history of other complications of pregnancy, childbirth and the puerperium: Secondary | ICD-10-CM

## 2020-06-24 DIAGNOSIS — I10 Essential (primary) hypertension: Secondary | ICD-10-CM

## 2020-06-24 NOTE — Progress Notes (Signed)
   PRENATAL VISIT NOTE  Subjective:  Emily Mack is a 30 y.o. [id number] at [redacted]w[redacted]d being seen today for ongoing prenatal care.  She is currently monitored for the following issues for this high-risk pregnancy and has Cystic fibrosis carrier; Previous cesarean section; MRSA carrier; Supervision of high-risk pregnancy; Chronic hypertension; History of IUFD at 17 weeks; and Anemia of pregnancy, third trimester on their problem list.  Patient reports no complaints.  Contractions: Irritability. Vag. Bleeding: None.  Movement: Present. Denies leaking of fluid.   The following portions of the patient's history were reviewed and updated as appropriate: allergies, current medications, past family history, past medical history, past social history, past surgical history and problem list.   Objective:   Vitals:   06/24/20 1446  BP: 123/83  Pulse: (!) 132  Weight: 156 lb (70.8 kg)    Fetal Status: Fetal Heart Rate (bpm): 145 Fundal Height: 30 cm Movement: Present     General:  Alert, oriented and cooperative. Patient is in no acute distress.  Skin: Skin is warm and dry. No rash noted.   Cardiovascular: Normal heart rate noted  Respiratory: Normal respiratory effort, no problems with respiration noted  Abdomen: Soft, gravid, appropriate for gestational age.  Pain/Pressure: Absent     Pelvic: Cervical exam deferred        Extremities: Normal range of motion.     Mental Status: Normal mood and affect. Normal behavior. Normal judgment and thought content.   Assessment and Plan:  Pregnancy: [id number] at [redacted]w[redacted]d 1. Anemia of pregnancy, third trimester Feeling better on iron Recheck at next visit  2. History of IUFD at 17 weeks   3. Supervision of high risk pregnancy in third trimester Up to date Reviewed contraception. Unsure currently discussed BTS vs IUD at time of surgery  4. Chronic hypertension BP wnl today Continue ASA Has growth [place] scheduled 4/11, last growth was 57th%  5. Previous  cesarean section Desires RCS At 39 weeks -- unless sooner. Prefers Friday and early AM  Preterm labor symptoms and general obstetric precautions including but not limited to vaginal bleeding, contractions, leaking of fluid and fetal movement were reviewed in detail with the patient. Please refer to After Visit Summary for other counseling recommendations.   Return in about 2 weeks (around 07/08/2020) for Routine prenatal care, MD or APP., plan on checking hgb .  Future Appointments  Date Time Provider Department Center  07/09/2020  8:30 AM Anyanwu, Jethro Bastos, MD [facility]  07/15/2020  2:15 PM [facility] NURSE [facility]  07/15/2020  2:30 PM [facility]3 [facility]  07/23/2020  8:30 AM Reva Bores, MD [facility]    Federico Flake, MD

## 2020-07-09 ENCOUNTER — Other Ambulatory Visit: Payer: Self-pay

## 2020-07-09 ENCOUNTER — Encounter: Payer: Self-pay | Admitting: *Deleted

## 2020-07-09 ENCOUNTER — Encounter: Payer: Self-pay | Admitting: Obstetrics & Gynecology

## 2020-07-09 ENCOUNTER — Ambulatory Visit (INDEPENDENT_AMBULATORY_CARE_PROVIDER_SITE_OTHER): Payer: Commercial Managed Care - PPO | Admitting: Obstetrics & Gynecology

## 2020-07-09 VITALS — BP 120/77 | HR 96 | Wt 158.0 lb

## 2020-07-09 DIAGNOSIS — Z3A33 33 weeks gestation of pregnancy: Secondary | ICD-10-CM

## 2020-07-09 DIAGNOSIS — O0993 Supervision of high risk pregnancy, unspecified, third trimester: Secondary | ICD-10-CM

## 2020-07-09 DIAGNOSIS — O10913 Unspecified pre-existing hypertension complicating pregnancy, third trimester: Secondary | ICD-10-CM

## 2020-07-09 DIAGNOSIS — Z98891 History of uterine scar from previous surgery: Secondary | ICD-10-CM

## 2020-07-09 NOTE — Patient Instructions (Signed)
Return to office for any scheduled appointments. Call the office or go to the MAU at [facility] if:  You begin to have strong, frequent contractions  Your water breaks.  Sometimes it is a big gush of fluid, sometimes it is just a trickle that keeps getting your panties wet or running down your legs  You have vaginal bleeding.  It is normal to have a small amount of spotting if your cervix was checked.   You do not feel your baby moving like normal.  If you do not, get something to eat and drink and lay down and focus on feeling your baby move.   If your baby is still not moving like normal, you should call the office or go to MAU.  Any other obstetric concerns.   

## 2020-07-09 NOTE — Progress Notes (Signed)
   PRENATAL VISIT NOTE  Subjective:  Emily Mack is a 30 y.o. G2R4270 at [redacted]w[redacted]d being seen today for ongoing prenatal care.  She is currently monitored for the following issues for this high-risk pregnancy and has Cystic fibrosis carrier; Previous cesarean section; MRSA carrier; Supervision of high-risk pregnancy; Chronic hypertension; History of IUFD at 17 weeks; and Anemia of pregnancy, third trimester on their problem list.  Patient reports no complaints.  Contractions: Irritability. Vag. Bleeding: None.  Movement: Present. Denies leaking of fluid.   The following portions of the patient's history were reviewed and updated as appropriate: allergies, current medications, past family history, past medical history, past social history, past surgical history and problem list.   Objective:   Vitals:   07/09/20 0831  BP: 120/77  Pulse: 96  Weight: 158 lb (71.7 kg)    Fetal Status: Fetal Heart Rate (bpm): 146   Movement: Present     General:  Alert, oriented and cooperative. Patient is in no acute distress.  Skin: Skin is warm and dry. No rash noted.   Cardiovascular: Normal heart rate noted  Respiratory: Normal respiratory effort, no problems with respiration noted  Abdomen: Soft, gravid, appropriate for gestational age.  Pain/Pressure: Absent     Pelvic: Cervical exam deferred        Extremities: Normal range of motion.  Edema: None  Mental Status: Normal mood and affect. Normal behavior. Normal judgment and thought content.   Assessment and Plan:  Pregnancy: [id number] at [redacted]w[redacted]d 1. Preexisting hypertension complicating pregnancy, antepartum, third trimester Normal BP. Continue ASA. No other meds. Growth scan scheduled 07/15/20.  2. Previous cesarean section Order sent to schedule RCS at 39 weeks. Patient understands it may be earlier for BP exacerbations or other reasons.   3. [redacted] weeks gestation of pregnancy 4. Supervision of high risk pregnancy in third trimester Preterm labor  symptoms and general obstetric precautions including but not limited to vaginal bleeding, contractions, leaking of fluid and fetal movement were reviewed in detail with the patient. Please refer to After Visit Summary for other counseling recommendations.   Return in about 2 weeks (around 07/23/2020) for OFFICE OB VISIT (MD only).  Future Appointments  Date Time Provider Department Center  07/15/2020  2:15 PM [facility] NURSE [facility]  07/15/2020  2:30 PM [facility] US3 [facility]  07/23/2020  8:30 AM Reva Bores, MD [facility]    Jaynie Collins, MD

## 2020-07-15 ENCOUNTER — Ambulatory Visit: Payer: Commercial Managed Care - PPO | Attending: Obstetrics and Gynecology

## 2020-07-15 ENCOUNTER — Other Ambulatory Visit: Payer: Self-pay

## 2020-07-15 ENCOUNTER — Encounter: Payer: Self-pay | Admitting: *Deleted

## 2020-07-15 ENCOUNTER — Ambulatory Visit: Payer: Commercial Managed Care - PPO | Admitting: *Deleted

## 2020-07-15 DIAGNOSIS — Z8759 Personal history of other complications of pregnancy, childbirth and the puerperium: Secondary | ICD-10-CM | POA: Diagnosis present

## 2020-07-15 DIAGNOSIS — Z3A34 34 weeks gestation of pregnancy: Secondary | ICD-10-CM

## 2020-07-15 DIAGNOSIS — O10013 Pre-existing essential hypertension complicating pregnancy, third trimester: Secondary | ICD-10-CM | POA: Diagnosis not present

## 2020-07-15 DIAGNOSIS — O10913 Unspecified pre-existing hypertension complicating pregnancy, third trimester: Secondary | ICD-10-CM | POA: Diagnosis present

## 2020-07-15 DIAGNOSIS — O09893 Supervision of other high risk pregnancies, third trimester: Secondary | ICD-10-CM

## 2020-07-15 DIAGNOSIS — Z141 Cystic fibrosis carrier: Secondary | ICD-10-CM

## 2020-07-15 DIAGNOSIS — O34219 Maternal care for unspecified type scar from previous cesarean delivery: Secondary | ICD-10-CM | POA: Diagnosis not present

## 2020-07-15 DIAGNOSIS — O99013 Anemia complicating pregnancy, third trimester: Secondary | ICD-10-CM

## 2020-07-15 DIAGNOSIS — O09293 Supervision of pregnancy with other poor reproductive or obstetric history, third trimester: Secondary | ICD-10-CM

## 2020-07-23 ENCOUNTER — Ambulatory Visit (INDEPENDENT_AMBULATORY_CARE_PROVIDER_SITE_OTHER): Payer: Commercial Managed Care - PPO | Admitting: Obstetrics & Gynecology

## 2020-07-23 ENCOUNTER — Other Ambulatory Visit: Payer: Self-pay

## 2020-07-23 ENCOUNTER — Encounter: Payer: Commercial Managed Care - PPO | Admitting: Family Medicine

## 2020-07-23 VITALS — BP 131/84 | HR 96 | Wt 158.0 lb

## 2020-07-23 DIAGNOSIS — O10913 Unspecified pre-existing hypertension complicating pregnancy, third trimester: Secondary | ICD-10-CM

## 2020-07-23 DIAGNOSIS — Z3A35 35 weeks gestation of pregnancy: Secondary | ICD-10-CM

## 2020-07-23 DIAGNOSIS — O99013 Anemia complicating pregnancy, third trimester: Secondary | ICD-10-CM

## 2020-07-23 DIAGNOSIS — Z98891 History of uterine scar from previous surgery: Secondary | ICD-10-CM

## 2020-07-23 DIAGNOSIS — O0993 Supervision of high risk pregnancy, unspecified, third trimester: Secondary | ICD-10-CM

## 2020-07-23 MED ORDER — FERROUS SULFATE 325 (65 FE) MG PO TABS: 325.0000 mg | ORAL_TABLET | ORAL | 3 refills | Status: DC

## 2020-07-23 NOTE — Progress Notes (Signed)
PRENATAL VISIT NOTE  Subjective:  Emily Mack is a [age] y.o. [id number] at 2941w4d being seen today for ongoing prenatal care.  She is currently monitored for the following issues for this high-risk pregnancy and has Cystic fibrosis carrier; Previous cesarean section; MRSA carrier; Supervision of high-risk pregnancy; Chronic hypertension; History of IUFD at 17 weeks; and Anemia of pregnancy, third trimester on their problem list.  Patient reports no complaints.  Contractions: Irritability. Vag. Bleeding: None.  Movement: Present. Denies leaking of fluid.   The following portions of the patient's history were reviewed and updated as appropriate: allergies, current medications, past family history, past medical history, past social history, past surgical history and problem list.   Objective:   Vitals:   07/23/20 1449  BP: 131/84  Pulse: 96  Weight: 158 lb (71.7 kg)    Fetal Status: Fetal Heart Rate (bpm): 153   Movement: Present     General:  Alert, oriented and cooperative. Patient is in no acute distress.  Skin: Skin is warm and dry. No rash noted.   Cardiovascular: Normal heart rate noted  Respiratory: Normal respiratory effort, no problems with respiration noted  Abdomen: Soft, gravid, appropriate for gestational age.  Pain/Pressure: Present     Pelvic: Cervical exam deferred        Extremities: Normal range of motion.     Mental Status: Normal mood and affect. Normal behavior. Normal judgment and thought content.   Imaging: US MFM OB FOLLOW UP  Result Date: 07/15/2020 ----------------------------------------------------------------------  OBSTETRICS REPORT                       (Signed Final 07/15/2020 05:46 pm) ---------------------------------------------------------------------- Patient Info  ID #:       [id number]                          D.O.B.:  1990-04-23 (29 yrs)  Name:       Emily Mack                Visit Date: 07/15/2020 02:41 pm  ---------------------------------------------------------------------- Performed By  Attending:        Ma RingsVictor Fang MD         Ref. Address:     [address]                                                             [address]  Performed By:     Tomma Lightningevin Vics             [facility]  Referred By:      [facility] ---------------------------------------------------------------------- Orders  #  Description                           Code        Ordered By  1  [place] MFM OB FOLLOW UP                   [phone]    Noralee Space ----------------------------------------------------------------------  #  Order #                     Accession #                Episode #  1  [id number]                   [id number]                 [id number] ---------------------------------------------------------------------- Indications  Hypertension - Chronic/Pre-existing (ASA)      O10.019  [redacted] weeks gestation of pregnancy                Z3A.34  History of cesarean delivery, currently        O34.219  pregnant  Poor obstetric history: Previous IUFD (17      O09.299  weeks)  Cystic Fibrosis (CF) Carrier, third trimester  O09.893 ---------------------------------------------------------------------- Fetal Evaluation  Num Of Fetuses:         1  Fetal Heart Rate(bpm):  140  Cardiac Activity:       Observed  Presentation:           Cephalic  Placenta:               Posterior  P. Cord Insertion:      Previously Visualized  Amniotic Fluid  AFI FV:      Mild Polyhydramnios  AFI Sum(cm)     %Tile       Largest Pocket(cm)  25.41           95          7.46  RUQ(cm)       RLQ(cm)       LUQ(cm)        LLQ(cm)  7.25          7.46          4.59           6.11  ---------------------------------------------------------------------- Biometry  BPD:      86.3  mm     G. Age:  34w 6d         61  %    CI:        71.01   %    70 - 86  FL/HC:      18.9   %    19.4 - 21.8  HC:      326.3  mm     G. Age:  37w 0d         79  %    HC/AC:      1.03        0.96 - 1.11  AC:      316.7  mm     G. Age:  35w 4d         85  %    FL/BPD:     71.6   %    71 - 87  FL:       61.8  mm     G. Age:  32w 0d        2.6  %    FL/AC:      19.5   %    20 - 24  HUM:        55  mm     G. Age:  32w 0d         11  %  LV:          4  mm  Est. FW:    2499  gm      5 lb 8 oz     54  % ---------------------------------------------------------------------- Gestational Age  LMP:           34w 3d        Date:  11/17/19                 EDD:   08/23/20  Clinical EDD:  34w 3d                                        EDD:   08/23/20  U/S Today:     34w 6d                                        EDD:   08/20/20  Best:          34w 3d     Det. By:  LMP  (11/17/19)          EDD:   08/23/20 ---------------------------------------------------------------------- Anatomy  Cranium:               Appears normal         Aortic Arch:            Previously seen  Cavum:                 Previously seen        Ductal Arch:            Previously seen  Ventricles:            Appears normal         Diaphragm:              Appears normal  Choroid Plexus:        Previously seen        Stomach:                Appears normal, left  sided  Cerebellum:            Previously seen        Abdomen:                Previously seen  Posterior Fossa:       Previously seen        Abdominal Wall:         Previously seen  Nuchal Fold:           Previously seen        Cord Vessels:           Previously seen  Face:                  Orbits and profile     Kidneys:                Appear normal                         previously seen   Lips:                  Previously seen        Bladder:                Appears normal  Thoracic:              Appears normal         Spine:                  Previously seen  Heart:                 Previously seen        Upper Extremities:      Previously seen  RVOT:                  Appears normal         Lower Extremities:      Previously seen  LVOT:                  Appears normal  Other:  Nasal bone prev visualized. Lenses prev visualized. VC, 3VV and          3VTV prev visualized. ---------------------------------------------------------------------- Cervix Uterus Adnexa  Cervix  Not visualized (advanced GA >24wks) ---------------------------------------------------------------------- Comments  This patient was seen for a follow up growth scan due to a  history of chronic hypertension that is not currently treated  with any medications.  She denies any problems since her  last exam.  She was informed that the fetal growth and amniotic fluid  level appears appropriate for her gestational age.  As the fetal growth is within normal limits, no further exams  were scheduled in our office ----------------------------------------------------------------------                   Ma Rings, MD Electronically Signed Final Report   07/15/2020 05:46 pm ----------------------------------------------------------------------   Assessment and Plan:  Pregnancy: Z6X0960 at [redacted]w[redacted]d 1. Preexisting hypertension complicating pregnancy, antepartum, third trimester Stable, no meds. Doing well. No antenatal testing needed.  2. Previous cesarean section RCS already scheduled at 39 weeks on 08/16/20. Was considering changing date but changed her mind.   3. Anemia of pregnancy, third trimester Iron refilled - ferrous sulfate (FERROUSUL) 325 (65 FE) MG tablet; Take 1 tablet (325 mg total) by mouth every other day.  Dispense: 30 tablet; Refill: 3  4. [redacted] weeks gestation of  pregnancy 5. Supervision of high risk pregnancy in third  trimester Preterm labor symptoms and general obstetric precautions including but not limited to vaginal bleeding, contractions, leaking of fluid and fetal movement were reviewed in detail with the patient. Please refer to After Visit Summary for other counseling recommendations.   Return in about 1 week (around 07/30/2020) for OFFICE OB VISIT (MD only), Pelvic cultures.  No future appointments.  Jaynie Collins, MD

## 2020-07-30 ENCOUNTER — Other Ambulatory Visit: Payer: Self-pay

## 2020-07-30 ENCOUNTER — Other Ambulatory Visit (HOSPITAL_COMMUNITY)
Admission: RE | Admit: 2020-07-30 | Discharge: 2020-07-30 | Disposition: A | Discharge status: Home / Self Care | Payer: Commercial Managed Care - PPO | Source: Ambulatory Visit | Attending: Obstetrics & Gynecology | Admitting: Obstetrics & Gynecology

## 2020-07-30 ENCOUNTER — Ambulatory Visit (INDEPENDENT_AMBULATORY_CARE_PROVIDER_SITE_OTHER): Payer: Commercial Managed Care - PPO | Admitting: Obstetrics & Gynecology

## 2020-07-30 VITALS — BP 131/86 | HR 90 | Wt 160.2 lb

## 2020-07-30 DIAGNOSIS — O10913 Unspecified pre-existing hypertension complicating pregnancy, third trimester: Secondary | ICD-10-CM

## 2020-07-30 DIAGNOSIS — Z3A36 36 weeks gestation of pregnancy: Secondary | ICD-10-CM | POA: Insufficient documentation

## 2020-07-30 DIAGNOSIS — Z98891 History of uterine scar from previous surgery: Secondary | ICD-10-CM

## 2020-07-30 DIAGNOSIS — O0993 Supervision of high risk pregnancy, unspecified, third trimester: Secondary | ICD-10-CM | POA: Insufficient documentation

## 2020-07-30 NOTE — Patient Instructions (Signed)
Return to office for any scheduled appointments. Call the office or go to the MAU at [facility] [place] if:  You begin to have strong, frequent contractions  Your water breaks.  Sometimes it is a big gush of fluid, sometimes it is just a trickle that keeps getting your panties wet or running down your legs  You have vaginal bleeding.  It is normal to have a small amount of spotting if your cervix was checked.   You do not feel your baby moving like normal.  If you do not, get something to eat and drink and lay down and focus on feeling your baby move.   If your baby is still not moving like normal, you should call the office or go to MAU.  Any other obstetric concerns.   

## 2020-07-30 NOTE — Progress Notes (Signed)
   PRENATAL VISIT NOTE  Subjective:  Emily Mack is a 30 y.o. O9G2952 at [redacted]w[redacted]d being seen today for ongoing prenatal care.  She is currently monitored for the following issues for this high-risk pregnancy and has Cystic fibrosis carrier; Previous cesarean section; MRSA carrier; Supervision of high-risk pregnancy; Chronic hypertension; History of IUFD at 17 weeks; and Anemia of pregnancy, third trimester on their problem list.  Patient reports no complaints.  Contractions: Irritability. Vag. Bleeding: None.  Movement: Present. Denies leaking of fluid.   The following portions of the patient's history were reviewed and updated as appropriate: allergies, current medications, past family history, past medical history, past social history, past surgical history and problem list.   Objective:   Vitals:   07/30/20 1616  BP: 131/86  Pulse: 90  Weight: 160 lb 3.2 oz (72.7 kg)    Fetal Status: Fetal Heart Rate (bpm): 138   Movement: Present     General:  Alert, oriented and cooperative. Patient is in no acute distress.  Skin: Skin is warm and dry. No rash noted.   Cardiovascular: Normal heart rate noted  Respiratory: Normal respiratory effort, no problems with respiration noted  Abdomen: Soft, gravid, appropriate for gestational age.  Pain/Pressure: Present     Pelvic: Cervical exam deferred        Extremities: Normal range of motion.     Mental Status: Normal mood and affect. Normal behavior. Normal judgment and thought content.   Assessment and Plan:  Pregnancy: W4X3244 at [redacted]w[redacted]d 1. Preexisting hypertension complicating pregnancy, antepartum, third trimester Stable, no meds. Doing well. No antenatal testing needed. Delivery scheduled at 39 weeks.  2. Previous cesarean section RCS already scheduled at 39 weeks on 08/16/20.  3. [redacted] weeks gestation of pregnancy 4. Supervision of high risk pregnancy in third trimester Pelvic cultures done. - Strep Gp B NAA - GC/Chlamydia probe amp  ([place])not at [facility] Preterm labor symptoms and general obstetric precautions including but not limited to vaginal bleeding, contractions, leaking of fluid and fetal movement were reviewed in detail with the patient. Please refer to After Visit Summary for other counseling recommendations.   Return in about 1 week (around 08/06/2020) for OFFICE OB VISIT (MD only).  Future Appointments  Date Time Provider Department Center  08/06/2020  4:00 PM Constant, Gigi Gin, MD [facility]  08/14/2020  3:30 PM Calvert Cantor, CNM [facility]    Jaynie Collins, MD

## 2020-08-01 LAB — GC/CHLAMYDIA PROBE AMP (~~LOC~~) NOT AT ARMC
Chlamydia: NEGATIVE
Comment: NEGATIVE
Comment: NORMAL
Neisseria Gonorrhea: NEGATIVE

## 2020-08-01 LAB — STREP GP B NAA: Strep Gp B NAA: NEGATIVE

## 2020-08-05 NOTE — Patient Instructions (Signed)
Emily Mack  08/05/2020   Your procedure is scheduled on:  08/16/2020  Arrive at 1030 at Entrance C on [address] at [facility]  and [employer]. You are invited to use the FREE valet parking or use the Visitor's parking deck.  Pick up the phone at the desk and dial [phone].  Call this number if you have problems the morning of surgery: [phone]  Remember:   Do not eat food:(After Midnight) Desps de medianoche.  Do not drink clear liquids: (After Midnight) Desps de medianoche.  Take these medicines the morning of surgery with A SIP OF WATER:  none   Do not wear jewelry, make-up or nail polish.  Do not wear lotions, powders, or perfumes. Do not wear deodorant.  Do not shave 48 hours prior to surgery.  Do not bring valuables to the hospital.  [facility] is not   responsible for any belongings or valuables brought to the hospital.  Contacts, dentures or bridgework may not be worn into surgery.  Leave suitcase in the car. After surgery it may be brought to your room.  For patients admitted to the hospital, checkout time is 11:00 AM the day of              discharge.      Please read over the following fact sheets that you were given:     Preparing for Surgery

## 2020-08-06 ENCOUNTER — Encounter (HOSPITAL_COMMUNITY): Payer: Self-pay

## 2020-08-06 ENCOUNTER — Other Ambulatory Visit: Payer: Self-pay

## 2020-08-06 ENCOUNTER — Ambulatory Visit (INDEPENDENT_AMBULATORY_CARE_PROVIDER_SITE_OTHER): Payer: Commercial Managed Care - PPO | Admitting: Obstetrics and Gynecology

## 2020-08-06 ENCOUNTER — Encounter: Payer: Self-pay | Admitting: Obstetrics and Gynecology

## 2020-08-06 VITALS — BP 126/86 | HR 114 | Wt 159.0 lb

## 2020-08-06 DIAGNOSIS — O0993 Supervision of high risk pregnancy, unspecified, third trimester: Secondary | ICD-10-CM

## 2020-08-06 DIAGNOSIS — I10 Essential (primary) hypertension: Secondary | ICD-10-CM

## 2020-08-06 DIAGNOSIS — O99013 Anemia complicating pregnancy, third trimester: Secondary | ICD-10-CM

## 2020-08-06 DIAGNOSIS — Z98891 History of uterine scar from previous surgery: Secondary | ICD-10-CM

## 2020-08-06 NOTE — Progress Notes (Signed)
   PRENATAL VISIT NOTE  Subjective:  Emily Mack is a 30 y.o. I7P8242 at [redacted]w[redacted]d being seen today for ongoing prenatal care.  She is currently monitored for the following issues for this high-risk pregnancy and has Cystic fibrosis carrier; Previous cesarean section; MRSA carrier; Supervision of high-risk pregnancy; Chronic hypertension; History of IUFD at 17 weeks; and Anemia of pregnancy, third trimester on their problem list.  Patient reports no complaints.  Contractions: Irritability. Vag. Bleeding: None.  Movement: Present. Denies leaking of fluid.   The following portions of the patient's history were reviewed and updated as appropriate: allergies, current medications, past family history, past medical history, past social history, past surgical history and problem list.   Objective:   Vitals:   08/06/20 1603  BP: 126/86  Pulse: (!) 114  Weight: 159 lb (72.1 kg)    Fetal Status: Fetal Heart Rate (bpm): 142 Fundal Height: 37 cm Movement: Present     General:  Alert, oriented and cooperative. Patient is in no acute distress.  Skin: Skin is warm and dry. No rash noted.   Cardiovascular: Normal heart rate noted  Respiratory: Normal respiratory effort, no problems with respiration noted  Abdomen: Soft, gravid, appropriate for gestational age.  Pain/Pressure: Present     Pelvic: Cervical exam deferred        Extremities: Normal range of motion.  Edema: None  Mental Status: Normal mood and affect. Normal behavior. Normal judgment and thought content.   Assessment and Plan:  Pregnancy: P5T6144 at [redacted]w[redacted]d 1. Supervision of high risk pregnancy in third trimester Patient is doing well without complaints  2. Chronic hypertension Normotensive without medication  3. Previous cesarean section Scheduled for repeat on 5/13  4. Anemia of pregnancy, third trimester Continue iron supplement  Term labor symptoms and general obstetric precautions including but not limited to vaginal  bleeding, contractions, leaking of fluid and fetal movement were reviewed in detail with the patient. Please refer to After Visit Summary for other counseling recommendations.   Return in about 1 week (around 08/13/2020) for in person, ROB, High risk.  Future Appointments  Date Time Provider Department Center  08/14/2020  9:30 AM [facility] PAT 1 [facility] None  08/14/2020 10:30 AM [facility] None  08/14/2020  3:30 PM Calvert Cantor, CNM [facility]    Catalina Antigua, MD

## 2020-08-14 ENCOUNTER — Encounter (HOSPITAL_COMMUNITY): Payer: Self-pay | Admitting: Obstetrics & Gynecology

## 2020-08-14 ENCOUNTER — Encounter (HOSPITAL_COMMUNITY)
Admission: RE | Admit: 2020-08-14 | Discharge: 2020-08-14 | Disposition: A | Discharge status: Home / Self Care | Payer: Commercial Managed Care - PPO | Source: Ambulatory Visit | Attending: Obstetrics & Gynecology | Admitting: Obstetrics & Gynecology

## 2020-08-14 ENCOUNTER — Other Ambulatory Visit: Payer: Self-pay

## 2020-08-14 ENCOUNTER — Ambulatory Visit (INDEPENDENT_AMBULATORY_CARE_PROVIDER_SITE_OTHER): Payer: Commercial Managed Care - PPO | Admitting: Advanced Practice Midwife

## 2020-08-14 ENCOUNTER — Inpatient Hospital Stay (HOSPITAL_COMMUNITY)
Admission: AD | Admit: 2020-08-14 | Discharge: 2020-08-17 | DRG: 788 | Disposition: A | Discharge status: Home / Self Care | Payer: Commercial Managed Care - PPO | Attending: Obstetrics and Gynecology | Admitting: Obstetrics and Gynecology

## 2020-08-14 ENCOUNTER — Other Ambulatory Visit (HOSPITAL_COMMUNITY)
Admission: RE | Admit: 2020-08-14 | Discharge: 2020-08-14 | Disposition: A | Discharge status: Home / Self Care | Payer: Commercial Managed Care - PPO | Source: Ambulatory Visit | Attending: Obstetrics & Gynecology | Admitting: Obstetrics & Gynecology

## 2020-08-14 VITALS — BP 147/105 | HR 102 | Wt 158.0 lb

## 2020-08-14 DIAGNOSIS — O99013 Anemia complicating pregnancy, third trimester: Secondary | ICD-10-CM | POA: Diagnosis present

## 2020-08-14 DIAGNOSIS — Z22322 Carrier or suspected carrier of Methicillin resistant Staphylococcus aureus: Secondary | ICD-10-CM

## 2020-08-14 DIAGNOSIS — O114 Pre-existing hypertension with pre-eclampsia, complicating childbirth: Secondary | ICD-10-CM | POA: Diagnosis not present

## 2020-08-14 DIAGNOSIS — Z98891 History of uterine scar from previous surgery: Secondary | ICD-10-CM

## 2020-08-14 DIAGNOSIS — O9902 Anemia complicating childbirth: Secondary | ICD-10-CM | POA: Diagnosis present

## 2020-08-14 DIAGNOSIS — O34211 Maternal care for low transverse scar from previous cesarean delivery: Secondary | ICD-10-CM | POA: Diagnosis present

## 2020-08-14 DIAGNOSIS — Z141 Cystic fibrosis carrier: Secondary | ICD-10-CM

## 2020-08-14 DIAGNOSIS — Z3A38 38 weeks gestation of pregnancy: Secondary | ICD-10-CM

## 2020-08-14 DIAGNOSIS — I10 Essential (primary) hypertension: Secondary | ICD-10-CM

## 2020-08-14 DIAGNOSIS — Z20822 Contact with and (suspected) exposure to covid-19: Secondary | ICD-10-CM | POA: Insufficient documentation

## 2020-08-14 DIAGNOSIS — O1410 Severe pre-eclampsia, unspecified trimester: Secondary | ICD-10-CM

## 2020-08-14 DIAGNOSIS — O1413 Severe pre-eclampsia, third trimester: Secondary | ICD-10-CM

## 2020-08-14 DIAGNOSIS — Z8759 Personal history of other complications of pregnancy, childbirth and the puerperium: Secondary | ICD-10-CM

## 2020-08-14 DIAGNOSIS — Z01812 Encounter for preprocedural laboratory examination: Secondary | ICD-10-CM | POA: Insufficient documentation

## 2020-08-14 DIAGNOSIS — N736 Female pelvic peritoneal adhesions (postinfective): Secondary | ICD-10-CM

## 2020-08-14 DIAGNOSIS — O0993 Supervision of high risk pregnancy, unspecified, third trimester: Secondary | ICD-10-CM

## 2020-08-14 DIAGNOSIS — O1002 Pre-existing essential hypertension complicating childbirth: Secondary | ICD-10-CM | POA: Diagnosis present

## 2020-08-14 DIAGNOSIS — Z3490 Encounter for supervision of normal pregnancy, unspecified, unspecified trimester: Secondary | ICD-10-CM

## 2020-08-14 LAB — CBC
HCT: 40.8 % (ref 36.0–46.0)
Hemoglobin: 13.5 g/dL (ref 12.0–15.0)
MCH: 29.6 pg (ref 26.0–34.0)
MCHC: 33.1 g/dL (ref 30.0–36.0)
MCV: 89.5 fL (ref 80.0–100.0)
Platelets: 253 10*3/uL (ref 150–400)
RBC: 4.56 MIL/uL (ref 3.87–5.11)
RDW: 15.9 % — ABNORMAL HIGH (ref 11.5–15.5)
WBC: 10.6 10*3/uL — ABNORMAL HIGH (ref 4.0–10.5)
nRBC: 0 % (ref 0.0–0.2)

## 2020-08-14 LAB — SARS CORONAVIRUS 2 (TAT 6-24 HRS): SARS Coronavirus 2: NEGATIVE

## 2020-08-14 MED ORDER — NORGESTIMATE-ETH ESTRADIOL 0.25-35 MG-MCG PO TABS: 1.0000 | ORAL_TABLET | Freq: Every day | ORAL | 11 refills | Status: DC

## 2020-08-14 MED ORDER — BUTALBITAL-APAP-CAFFEINE 50-325-40 MG PO TABS
1.0000 | ORAL_TABLET | Freq: Once | ORAL | Status: AC
  Administered 2020-08-14: 1 via ORAL
  Filled 2020-08-14: qty 1

## 2020-08-14 NOTE — MAU Note (Signed)
Pt reports she was seen at [facility] office and her b/p was really high, also reports she has had a headache all day.

## 2020-08-14 NOTE — Progress Notes (Signed)
   PRENATAL VISIT NOTE  Subjective:  Emily Mack is a 30 y.o. [id number] at [redacted]w[redacted]d being seen today for ongoing prenatal care.  She is currently monitored for the following issues for this high-risk pregnancy and has Cystic fibrosis carrier; Previous cesarean section; MRSA carrier; Supervision of high-risk pregnancy; Chronic hypertension; History of IUFD at 17 weeks; and Anemia of pregnancy, third trimester on their problem list.  Patient reports no complaints.  Contractions: Irritability. Vag. Bleeding: None.  Movement: Present. Denies leaking of fluid.   The following portions of the patient's history were reviewed and updated as appropriate: allergies, current medications, past family history, past medical history, past social history, past surgical history and problem list. Problem list updated.  Objective:   Vitals:   08/14/20 1539 08/14/20 1553 08/14/20 1554  BP: (!) 142/93 (!) 148/92 (!) 147/105  Pulse: (!) 102    Weight: 158 lb (71.7 kg)      Fetal Status: Fetal Heart Rate (bpm): 155   Movement: Present     General:  Alert, oriented and cooperative. Patient is in no acute distress.  Skin: Skin is warm and dry. No rash noted.   Cardiovascular: Normal heart rate noted  Respiratory: Normal respiratory effort, no problems with respiration noted  Abdomen: Soft, gravid, appropriate for gestational age.  Pain/Pressure: Present     Pelvic: Cervical exam deferred        Extremities: Normal range of motion.  Edema: None  Mental Status: Normal mood and affect. Normal behavior. Normal judgment and thought content.   Assessment and Plan:  Pregnancy: [id number] at [redacted]w[redacted]d  1. Supervision of high risk pregnancy in third trimester - Patient comfortable with location of MAU  2. Chronic hypertension - Elevated BP today, not on medication - "faint headache". Advised to take Tylenol, present to MAU if mild headache is not resolved 60 minutes after 1,000 mg Tylenol  3. Previous cesarean  section - Repeat cesarean on 05/13  4. Anemia of pregnancy, third trimester - Hgb 13.5 today  5. [redacted] weeks gestation of pregnancy   Term labor symptoms and general obstetric precautions including but not limited to vaginal bleeding, contractions, leaking of fluid and fetal movement were reviewed in detail with the patient. Please refer to After Visit Summary for other counseling recommendations.  No follow-ups on file.  No future appointments.  Calvert Cantor, CNM

## 2020-08-15 ENCOUNTER — Inpatient Hospital Stay (HOSPITAL_COMMUNITY): Payer: Commercial Managed Care - PPO | Admitting: Anesthesiology

## 2020-08-15 ENCOUNTER — Encounter (HOSPITAL_COMMUNITY): Payer: Self-pay | Admitting: Obstetrics & Gynecology

## 2020-08-15 ENCOUNTER — Encounter (HOSPITAL_COMMUNITY)
Admission: AD | Disposition: A | Discharge status: Home / Self Care | Payer: Self-pay | Source: Home / Self Care | Attending: Obstetrics and Gynecology

## 2020-08-15 DIAGNOSIS — Z3490 Encounter for supervision of normal pregnancy, unspecified, unspecified trimester: Secondary | ICD-10-CM

## 2020-08-15 DIAGNOSIS — Z22322 Carrier or suspected carrier of Methicillin resistant Staphylococcus aureus: Secondary | ICD-10-CM | POA: Diagnosis not present

## 2020-08-15 DIAGNOSIS — Z3A38 38 weeks gestation of pregnancy: Secondary | ICD-10-CM

## 2020-08-15 DIAGNOSIS — O1410 Severe pre-eclampsia, unspecified trimester: Secondary | ICD-10-CM

## 2020-08-15 DIAGNOSIS — O1002 Pre-existing essential hypertension complicating childbirth: Secondary | ICD-10-CM | POA: Diagnosis present

## 2020-08-15 DIAGNOSIS — O114 Pre-existing hypertension with pre-eclampsia, complicating childbirth: Secondary | ICD-10-CM | POA: Diagnosis present

## 2020-08-15 DIAGNOSIS — Z141 Cystic fibrosis carrier: Secondary | ICD-10-CM | POA: Diagnosis not present

## 2020-08-15 DIAGNOSIS — O1092 Unspecified pre-existing hypertension complicating childbirth: Secondary | ICD-10-CM | POA: Diagnosis not present

## 2020-08-15 DIAGNOSIS — O34211 Maternal care for low transverse scar from previous cesarean delivery: Secondary | ICD-10-CM | POA: Diagnosis present

## 2020-08-15 DIAGNOSIS — O9902 Anemia complicating childbirth: Secondary | ICD-10-CM | POA: Diagnosis present

## 2020-08-15 DIAGNOSIS — Z20822 Contact with and (suspected) exposure to covid-19: Secondary | ICD-10-CM | POA: Diagnosis present

## 2020-08-15 HISTORY — DX: Severe pre-eclampsia, unspecified trimester: O14.10

## 2020-08-15 LAB — TYPE AND SCREEN
ABO/RH(D): A POS
ABO/RH(D): A POS
Antibody Screen: NEGATIVE
Antibody Screen: NEGATIVE

## 2020-08-15 LAB — COMPREHENSIVE METABOLIC PANEL
ALT: 15 U/L (ref 0–44)
AST: 20 U/L (ref 15–41)
Albumin: 2.8 g/dL — ABNORMAL LOW (ref 3.5–5.0)
Alkaline Phosphatase: 193 U/L — ABNORMAL HIGH (ref 38–126)
Anion gap: 10 (ref 5–15)
BUN: 9 mg/dL (ref 6–20)
CO2: 20 mmol/L — ABNORMAL LOW (ref 22–32)
Calcium: 9.4 mg/dL (ref 8.9–10.3)
Chloride: 104 mmol/L (ref 98–111)
Creatinine, Ser: 0.66 mg/dL (ref 0.44–1.00)
GFR, Estimated: >60 mL/min (ref >60)
Glucose, Bld: 91 mg/dL (ref 70–99)
Potassium: 4.2 mmol/L (ref 3.5–5.1)
Sodium: 134 mmol/L — ABNORMAL LOW (ref 135–145)
Total Bilirubin: 0.7 mg/dL (ref 0.3–1.2)
Total Protein: 6.6 g/dL (ref 6.5–8.1)

## 2020-08-15 LAB — PROTEIN / CREATININE RATIO, URINE
Creatinine, Urine: 71.09 mg/dL
Protein Creatinine Ratio: 0.59 mg/mg{Cre} — ABNORMAL HIGH (ref 0.00–0.15)
Total Protein, Urine: 42 mg/dL

## 2020-08-15 LAB — CBC
HCT: 39.5 % (ref 36.0–46.0)
HCT: 37.8 % (ref 36.0–46.0)
HCT: 28.6 % — ABNORMAL LOW (ref 36.0–46.0)
Hemoglobin: 13.1 g/dL (ref 12.0–15.0)
Hemoglobin: 12.6 g/dL (ref 12.0–15.0)
Hemoglobin: 9.3 g/dL — ABNORMAL LOW (ref 12.0–15.0)
MCH: 29.4 pg (ref 26.0–34.0)
MCH: 29.9 pg (ref 26.0–34.0)
MCH: 29.5 pg (ref 26.0–34.0)
MCHC: 33.2 g/dL (ref 30.0–36.0)
MCHC: 33.3 g/dL (ref 30.0–36.0)
MCHC: 32.5 g/dL (ref 30.0–36.0)
MCV: 88.8 fL (ref 80.0–100.0)
MCV: 89.8 fL (ref 80.0–100.0)
MCV: 90.8 fL (ref 80.0–100.0)
Platelets: 239 10*3/uL (ref 150–400)
Platelets: 240 10*3/uL (ref 150–400)
Platelets: 221 10*3/uL (ref 150–400)
RBC: 4.45 MIL/uL (ref 3.87–5.11)
RBC: 4.21 MIL/uL (ref 3.87–5.11)
RBC: 3.15 MIL/uL — ABNORMAL LOW (ref 3.87–5.11)
RDW: 15.9 % — ABNORMAL HIGH (ref 11.5–15.5)
RDW: 15.9 % — ABNORMAL HIGH (ref 11.5–15.5)
RDW: 15.9 % — ABNORMAL HIGH (ref 11.5–15.5)
WBC: 11.6 10*3/uL — ABNORMAL HIGH (ref 4.0–10.5)
WBC: 16.5 10*3/uL — ABNORMAL HIGH (ref 4.0–10.5)
WBC: 18.4 10*3/uL — ABNORMAL HIGH (ref 4.0–10.5)
nRBC: 0 % (ref 0.0–0.2)
nRBC: 0 % (ref 0.0–0.2)
nRBC: 0 % (ref 0.0–0.2)

## 2020-08-15 LAB — RPR: RPR Ser Ql: NONREACTIVE

## 2020-08-15 SURGERY — Surgical Case
Anesthesia: Spinal

## 2020-08-15 MED ORDER — MAGNESIUM SULFATE BOLUS VIA INFUSION
4.0000 g | Freq: Once | INTRAVENOUS | Status: AC
  Administered 2020-08-15: 4 g via INTRAVENOUS
  Filled 2020-08-15: qty 1000

## 2020-08-15 MED ORDER — LABETALOL HCL 5 MG/ML IV SOLN: 40.0000 mg | INTRAVENOUS | Status: DC | PRN

## 2020-08-15 MED ORDER — ACETAMINOPHEN 500 MG PO TABS
1000.0000 mg | ORAL_TABLET | Freq: Four times a day (QID) | ORAL | Status: DC
  Administered 2020-08-15 – 2020-08-17 (×7): 1000 mg via ORAL
  Filled 2020-08-15 (×8): qty 2

## 2020-08-15 MED ORDER — NALBUPHINE HCL 10 MG/ML IJ SOLN: 5.0000 mg | INTRAMUSCULAR | Status: DC | PRN

## 2020-08-15 MED ORDER — ACETAMINOPHEN 160 MG/5ML PO SOLN: 1000.0000 mg | Freq: Once | ORAL | Status: DC

## 2020-08-15 MED ORDER — ENOXAPARIN SODIUM 40 MG/0.4ML IJ SOSY
40.0000 mg | PREFILLED_SYRINGE | INTRAMUSCULAR | Status: DC
  Administered 2020-08-15 – 2020-08-16 (×2): 40 mg via SUBCUTANEOUS
  Filled 2020-08-15 (×2): qty 0.4

## 2020-08-15 MED ORDER — MORPHINE SULFATE (PF) 0.5 MG/ML IJ SOLN
INTRAMUSCULAR | Status: AC
  Filled 2020-08-15: qty 10

## 2020-08-15 MED ORDER — LACTATED RINGERS IV SOLN: INTRAVENOUS | Status: DC | PRN

## 2020-08-15 MED ORDER — BUPIVACAINE IN DEXTROSE 0.75-8.25 % IT SOLN
INTRATHECAL | Status: DC | PRN
  Administered 2020-08-15: 1.6 mL via INTRATHECAL

## 2020-08-15 MED ORDER — FUROSEMIDE 10 MG/ML IJ SOLN
20.0000 mg | Freq: Once | INTRAMUSCULAR | Status: AC
  Administered 2020-08-15: 20 mg via INTRAVENOUS
  Filled 2020-08-15: qty 2

## 2020-08-15 MED ORDER — DIBUCAINE (PERIANAL) 1 % EX OINT: 1.0000 "application " | TOPICAL_OINTMENT | CUTANEOUS | Status: DC | PRN

## 2020-08-15 MED ORDER — KETOROLAC TROMETHAMINE 30 MG/ML IJ SOLN: 30.0000 mg | Freq: Four times a day (QID) | INTRAMUSCULAR | Status: AC | PRN

## 2020-08-15 MED ORDER — SODIUM CHLORIDE 0.9 % IR SOLN
Status: DC | PRN
  Administered 2020-08-15: 1

## 2020-08-15 MED ORDER — TETANUS-DIPHTH-ACELL PERTUSSIS 5-2.5-18.5 LF-MCG/0.5 IM SUSY: 0.5000 mL | PREFILLED_SYRINGE | Freq: Once | INTRAMUSCULAR | Status: DC

## 2020-08-15 MED ORDER — FENTANYL CITRATE (PF) 100 MCG/2ML IJ SOLN
INTRAMUSCULAR | Status: DC | PRN
  Administered 2020-08-15: 15 ug via INTRATHECAL

## 2020-08-15 MED ORDER — BUPIVACAINE HCL (PF) 0.5 % IJ SOLN
INTRAMUSCULAR | Status: DC | PRN
  Administered 2020-08-15: 30 mL

## 2020-08-15 MED ORDER — COCONUT OIL OIL: 1.0000 "application " | TOPICAL_OIL | Status: DC | PRN

## 2020-08-15 MED ORDER — IBUPROFEN 600 MG PO TABS
600.0000 mg | ORAL_TABLET | Freq: Four times a day (QID) | ORAL | Status: DC
  Administered 2020-08-16 – 2020-08-17 (×5): 600 mg via ORAL
  Filled 2020-08-15 (×5): qty 1

## 2020-08-15 MED ORDER — LABETALOL HCL 5 MG/ML IV SOLN: 20.0000 mg | INTRAVENOUS | Status: DC | PRN

## 2020-08-15 MED ORDER — CEFAZOLIN SODIUM-DEXTROSE 2-4 GM/100ML-% IV SOLN
2.0000 g | INTRAVENOUS | Status: AC
  Administered 2020-08-15: 2 g via INTRAVENOUS

## 2020-08-15 MED ORDER — OXYTOCIN-SODIUM CHLORIDE 30-0.9 UT/500ML-% IV SOLN
INTRAVENOUS | Status: DC | PRN
  Administered 2020-08-15: 350 mL via INTRAVENOUS

## 2020-08-15 MED ORDER — DEXAMETHASONE SODIUM PHOSPHATE 4 MG/ML IJ SOLN
INTRAMUSCULAR | Status: DC | PRN
  Administered 2020-08-15: 4 mg via INTRAVENOUS

## 2020-08-15 MED ORDER — ONDANSETRON HCL 4 MG/2ML IJ SOLN
INTRAMUSCULAR | Status: AC
  Filled 2020-08-15: qty 2

## 2020-08-15 MED ORDER — ACETAMINOPHEN 500 MG PO TABS: 1000.0000 mg | ORAL_TABLET | Freq: Once | ORAL | Status: DC

## 2020-08-15 MED ORDER — DIPHENHYDRAMINE HCL 50 MG/ML IJ SOLN
12.5000 mg | Freq: Four times a day (QID) | INTRAMUSCULAR | Status: DC | PRN
Start: 2020-08-15 — End: 2020-08-17

## 2020-08-15 MED ORDER — MORPHINE SULFATE (PF) 0.5 MG/ML IJ SOLN
INTRAMUSCULAR | Status: DC | PRN
  Administered 2020-08-15: .15 mg via INTRATHECAL

## 2020-08-15 MED ORDER — PRENATAL MULTIVITAMIN CH
1.0000 | ORAL_TABLET | Freq: Every day | ORAL | Status: DC
  Administered 2020-08-15 – 2020-08-16 (×2): 1 via ORAL
  Filled 2020-08-15 (×2): qty 1

## 2020-08-15 MED ORDER — ONDANSETRON HCL 4 MG/2ML IJ SOLN
INTRAMUSCULAR | Status: DC | PRN
  Administered 2020-08-15: 4 mg via INTRAVENOUS

## 2020-08-15 MED ORDER — STERILE WATER FOR IRRIGATION IR SOLN
Status: DC | PRN
  Administered 2020-08-15: 1

## 2020-08-15 MED ORDER — FENTANYL CITRATE (PF) 100 MCG/2ML IJ SOLN: 25.0000 ug | INTRAMUSCULAR | Status: DC | PRN

## 2020-08-15 MED ORDER — NIFEDIPINE ER OSMOTIC RELEASE 30 MG PO TB24
30.0000 mg | ORAL_TABLET | Freq: Every day | ORAL | Status: DC
  Administered 2020-08-15 – 2020-08-17 (×3): 30 mg via ORAL
  Filled 2020-08-15 (×3): qty 1

## 2020-08-15 MED ORDER — DEXAMETHASONE SODIUM PHOSPHATE 4 MG/ML IJ SOLN
INTRAMUSCULAR | Status: AC
  Filled 2020-08-15: qty 1

## 2020-08-15 MED ORDER — SOD CITRATE-CITRIC ACID 500-334 MG/5ML PO SOLN
30.0000 mL | ORAL | Status: AC
  Administered 2020-08-15: 30 mL via ORAL
  Filled 2020-08-15: qty 15

## 2020-08-15 MED ORDER — TRANEXAMIC ACID-NACL 1000-0.7 MG/100ML-% IV SOLN
INTRAVENOUS | Status: AC
  Filled 2020-08-15: qty 100

## 2020-08-15 MED ORDER — DIPHENHYDRAMINE HCL 25 MG PO CAPS: 25.0000 mg | ORAL_CAPSULE | Freq: Four times a day (QID) | ORAL | Status: DC | PRN

## 2020-08-15 MED ORDER — LABETALOL HCL 5 MG/ML IV SOLN: 80.0000 mg | INTRAVENOUS | Status: DC | PRN

## 2020-08-15 MED ORDER — KETOROLAC TROMETHAMINE 30 MG/ML IJ SOLN
30.0000 mg | Freq: Four times a day (QID) | INTRAMUSCULAR | Status: AC
  Administered 2020-08-15 – 2020-08-16 (×4): 30 mg via INTRAVENOUS
  Filled 2020-08-15 (×4): qty 1

## 2020-08-15 MED ORDER — BUPIVACAINE HCL (PF) 0.5 % IJ SOLN
INTRAMUSCULAR | Status: AC
  Filled 2020-08-15: qty 30

## 2020-08-15 MED ORDER — PHENYLEPHRINE HCL-NACL 20-0.9 MG/250ML-% IV SOLN
INTRAVENOUS | Status: AC
  Filled 2020-08-15: qty 250

## 2020-08-15 MED ORDER — NALBUPHINE HCL 10 MG/ML IJ SOLN
5.0000 mg | Freq: Once | INTRAMUSCULAR | Status: AC | PRN
  Administered 2020-08-15: 5 mg via INTRAVENOUS
  Filled 2020-08-15: qty 1

## 2020-08-15 MED ORDER — MAGNESIUM SULFATE 40 GM/1000ML IV SOLN
2.0000 g/h | INTRAVENOUS | Status: DC
  Administered 2020-08-15: 2 g/h via INTRAVENOUS
  Filled 2020-08-15: qty 1000

## 2020-08-15 MED ORDER — OXYTOCIN-SODIUM CHLORIDE 30-0.9 UT/500ML-% IV SOLN: 2.5000 [IU]/h | INTRAVENOUS | Status: AC

## 2020-08-15 MED ORDER — LACTATED RINGERS IV SOLN: INTRAVENOUS | Status: DC

## 2020-08-15 MED ORDER — WITCH HAZEL-GLYCERIN EX PADS: 1.0000 "application " | MEDICATED_PAD | CUTANEOUS | Status: DC | PRN

## 2020-08-15 MED ORDER — PHENYLEPHRINE HCL-NACL 20-0.9 MG/250ML-% IV SOLN
INTRAVENOUS | Status: DC | PRN
  Administered 2020-08-15: 60 ug/min via INTRAVENOUS

## 2020-08-15 MED ORDER — ACETAMINOPHEN 500 MG PO TABS: 1000.0000 mg | ORAL_TABLET | Freq: Four times a day (QID) | ORAL | Status: DC

## 2020-08-15 MED ORDER — CEFAZOLIN SODIUM-DEXTROSE 2-4 GM/100ML-% IV SOLN
INTRAVENOUS | Status: AC
  Filled 2020-08-15: qty 100

## 2020-08-15 MED ORDER — TRANEXAMIC ACID-NACL 1000-0.7 MG/100ML-% IV SOLN
INTRAVENOUS | Status: DC | PRN
  Administered 2020-08-15: 1000 mg via INTRAVENOUS

## 2020-08-15 MED ORDER — OXYCODONE HCL 5 MG PO TABS
5.0000 mg | ORAL_TABLET | ORAL | Status: DC | PRN
  Administered 2020-08-16: 5 mg via ORAL
  Filled 2020-08-15: qty 1

## 2020-08-15 MED ORDER — SIMETHICONE 80 MG PO CHEW
80.0000 mg | CHEWABLE_TABLET | Freq: Three times a day (TID) | ORAL | Status: DC
  Administered 2020-08-15 – 2020-08-17 (×7): 80 mg via ORAL
  Filled 2020-08-15 (×7): qty 1

## 2020-08-15 MED ORDER — PHENYLEPHRINE HCL (PRESSORS) 10 MG/ML IV SOLN
INTRAVENOUS | Status: DC | PRN
  Administered 2020-08-15: 120 ug via INTRAVENOUS
  Administered 2020-08-15: 80 ug via INTRAVENOUS

## 2020-08-15 MED ORDER — ONDANSETRON HCL 4 MG/2ML IJ SOLN: 4.0000 mg | Freq: Three times a day (TID) | INTRAMUSCULAR | Status: DC | PRN

## 2020-08-15 MED ORDER — MAGNESIUM SULFATE 40 GM/1000ML IV SOLN
2.0000 g/h | INTRAVENOUS | Status: AC
  Administered 2020-08-15: 2 g/h via INTRAVENOUS
  Filled 2020-08-15: qty 1000

## 2020-08-15 MED ORDER — SENNOSIDES-DOCUSATE SODIUM 8.6-50 MG PO TABS
2.0000 | ORAL_TABLET | Freq: Every day | ORAL | Status: DC
  Administered 2020-08-16 – 2020-08-17 (×2): 2 via ORAL
  Filled 2020-08-15 (×2): qty 2

## 2020-08-15 MED ORDER — HYDRALAZINE HCL 20 MG/ML IJ SOLN: 10.0000 mg | INTRAMUSCULAR | Status: DC | PRN

## 2020-08-15 MED ORDER — SODIUM CHLORIDE 0.9% FLUSH: 3.0000 mL | INTRAVENOUS | Status: DC | PRN

## 2020-08-15 MED ORDER — KETOROLAC TROMETHAMINE 30 MG/ML IJ SOLN: 30.0000 mg | Freq: Once | INTRAMUSCULAR | Status: DC

## 2020-08-15 MED ORDER — NALBUPHINE HCL 10 MG/ML IJ SOLN: 5.0000 mg | Freq: Once | INTRAMUSCULAR | Status: AC | PRN

## 2020-08-15 MED ORDER — SIMETHICONE 80 MG PO CHEW
80.0000 mg | CHEWABLE_TABLET | ORAL | Status: DC | PRN
Start: 2020-08-15 — End: 2020-08-17

## 2020-08-15 MED ORDER — FENTANYL CITRATE (PF) 100 MCG/2ML IJ SOLN
INTRAMUSCULAR | Status: AC
  Filled 2020-08-15: qty 2

## 2020-08-15 MED ORDER — PROMETHAZINE HCL 25 MG/ML IJ SOLN: 6.2500 mg | INTRAMUSCULAR | Status: DC | PRN

## 2020-08-15 MED ORDER — NALOXONE HCL 0.4 MG/ML IJ SOLN: 0.4000 mg | INTRAMUSCULAR | Status: DC | PRN

## 2020-08-15 MED ORDER — MENTHOL 3 MG MT LOZG: 1.0000 | LOZENGE | OROMUCOSAL | Status: DC | PRN

## 2020-08-15 MED ORDER — PHENYLEPHRINE 40 MCG/ML (10ML) SYRINGE FOR IV PUSH (FOR BLOOD PRESSURE SUPPORT)
PREFILLED_SYRINGE | INTRAVENOUS | Status: AC
  Filled 2020-08-15: qty 10

## 2020-08-15 MED ORDER — NALOXONE HCL 4 MG/10ML IJ SOLN
1.0000 ug/kg/h | INTRAVENOUS | Status: DC | PRN
  Filled 2020-08-15: qty 5

## 2020-08-15 SURGICAL SUPPLY — 34 items
BENZOIN TINCTURE PRP APPL 2/3 (GAUZE/BANDAGES/DRESSINGS) ×1 IMPLANT
CHLORAPREP W/TINT 26ML (MISCELLANEOUS) ×2 IMPLANT
CLAMP CORD UMBIL (MISCELLANEOUS) IMPLANT
CLOTH BEACON ORANGE TIMEOUT ST (SAFETY) ×2 IMPLANT
DRAIN JACKSON PRT FLT 7MM (DRAIN) IMPLANT
DRSG OPSITE POSTOP 4X10 (GAUZE/BANDAGES/DRESSINGS) ×2 IMPLANT
ELECT REM PT RETURN 9FT ADLT (ELECTROSURGICAL) ×2
ELECTRODE REM PT RTRN 9FT ADLT (ELECTROSURGICAL) ×1 IMPLANT
EVACUATOR SILICONE 100CC (DRAIN) IMPLANT
EXTRACTOR VACUUM M CUP 4 TUBE (SUCTIONS) IMPLANT
GLOVE BIO SURGEON STRL SZ7 (GLOVE) ×2 IMPLANT
GLOVE BIOGEL PI IND STRL 7.0 (GLOVE) ×2 IMPLANT
GLOVE BIOGEL PI INDICATOR 7.0 (GLOVE) ×2
GOWN STRL REUS W/TWL LRG LVL3 (GOWN DISPOSABLE) ×4 IMPLANT
KIT ABG SYR 3ML LUER SLIP (SYRINGE) IMPLANT
NDL HYPO 25X5/8 SAFETYGLIDE (NEEDLE) ×1 IMPLANT
NEEDLE HYPO 22GX1.5 SAFETY (NEEDLE) ×1 IMPLANT
NEEDLE HYPO 25X5/8 SAFETYGLIDE (NEEDLE) ×2 IMPLANT
NS IRRIG 1000ML POUR BTL (IV SOLUTION) ×2 IMPLANT
PACK C SECTION WH (CUSTOM PROCEDURE TRAY) ×2 IMPLANT
PAD OB MATERNITY 4.3X12.25 (PERSONAL CARE ITEMS) ×2 IMPLANT
PENCIL SMOKE EVAC W/HOLSTER (ELECTROSURGICAL) ×2 IMPLANT
RTRCTR C-SECT PINK 25CM LRG (MISCELLANEOUS) ×3 IMPLANT
SUT PLAIN 2 0 (SUTURE) ×1
SUT PLAIN ABS 2-0 CT1 27XMFL (SUTURE) IMPLANT
SUT VIC AB 0 CT1 36 (SUTURE) ×1 IMPLANT
SUT VIC AB 0 CTX 36 (SUTURE) ×5
SUT VIC AB 0 CTX36XBRD ANBCTRL (SUTURE) ×5 IMPLANT
SUT VIC AB 4-0 KS 27 (SUTURE) ×2 IMPLANT
SYR CONTROL 10ML LL (SYRINGE) ×1 IMPLANT
SYR KIT LINE DRAW 1CC W/FILTR (LINER) ×2 IMPLANT
TOWEL OR 17X24 6PK STRL BLUE (TOWEL DISPOSABLE) ×2 IMPLANT
TRAY FOLEY W/BAG SLVR 14FR LF (SET/KITS/TRAYS/PACK) ×2 IMPLANT
WATER STERILE IRR 1000ML POUR (IV SOLUTION) ×2 IMPLANT

## 2020-08-15 NOTE — Anesthesia Postprocedure Evaluation (Signed)
Anesthesia Post Note  Patient: Soil scientist  Procedure(s) Performed: CESAREAN SECTION (N/A )     Patient location during evaluation: PACU Anesthesia Type: Spinal Level of consciousness: awake and alert and oriented Pain management: pain level controlled Vital Signs Assessment: post-procedure vital signs reviewed and stable Respiratory status: spontaneous breathing, nonlabored ventilation and respiratory function stable Cardiovascular status: blood pressure returned to baseline Postop Assessment: no apparent nausea or vomiting, spinal receding, no headache and no backache Anesthetic complications: no   No complications documented.  Last Vitals:  Vitals:   08/15/20 0400 08/15/20 0415  BP: 126/86 129/80  Pulse: 89 92  Resp: 15 15  Temp: 36.6 C   SpO2:      Last Pain:  Vitals:   08/15/20 0420  TempSrc:   PainSc: 0-No pain   Pain Goal:    LLE Motor Response: Purposeful movement (08/15/20 0420) LLE Sensation: Full sensation (08/15/20 0420) RLE Motor Response: Purposeful movement (08/15/20 0420) RLE Sensation: Full sensation (08/15/20 0420)     Epidural/Spinal Function Cutaneous sensation: Normal sensation (08/15/20 0420), Patient able to flex knees: Yes (08/15/20 0420), Patient able to lift hips off bed: No (08/15/20 0420), Back pain beyond tenderness at insertion site: No (08/15/20 0420), Progressively worsening motor and/or sensory loss: No (08/15/20 0420), Bowel and/or bladder incontinence post epidural: No (08/15/20 0420)  Kaylyn Layer

## 2020-08-15 NOTE — Anesthesia Preprocedure Evaluation (Signed)
Anesthesia Evaluation  Patient identified by MRN, date of birth, ID band Patient awake    Reviewed: Allergy & Precautions, NPO status , Patient's Chart, lab work & pertinent test results  History of Anesthesia Complications Negative for: history of anesthetic complications  Airway Mallampati: II  TM Distance: >3 FB Neck ROM: Full    Dental  (+) Missing,    Pulmonary neg pulmonary ROS,    Pulmonary exam normal        Cardiovascular hypertension, Normal cardiovascular exam     Neuro/Psych negative neurological ROS  negative psych ROS   GI/Hepatic negative GI ROS, Neg liver ROS,   Endo/Other  negative endocrine ROS  Renal/GU negative Renal ROS  negative genitourinary   Musculoskeletal negative musculoskeletal ROS (+)   Abdominal   Peds  Hematology negative hematology ROS (+)   Anesthesia Other Findings Day of surgery medications reviewed with patient.  Reproductive/Obstetrics (+) Pregnancy (Hx of C/S x2, preE with SF)                             Anesthesia Physical Anesthesia Plan  ASA: III and emergent  Anesthesia Plan: Spinal   Post-op Pain Management:    Induction:   PONV Risk Score and Plan: 4 or greater and Treatment may vary due to age or medical condition, Ondansetron and Dexamethasone  Airway Management Planned: Natural Airway  Additional Equipment: None  Intra-op Plan:   Post-operative Plan:   Informed Consent: I have reviewed the patients History and Physical, chart, labs and discussed the procedure including the risks, benefits and alternatives for the proposed anesthesia with the patient or authorized representative who has indicated his/her understanding and acceptance.       Plan Discussed with: CRNA  Anesthesia Plan Comments:         Anesthesia Quick Evaluation

## 2020-08-15 NOTE — H&P (Addendum)
OBSTETRIC ADMISSION HISTORY AND PHYSICAL  Emily Mack is a 30 y.o. female [phone] with IUP at [redacted]w[redacted]d by L/11 presenting for concern of worsening headaches for 1 day in addition to worsening blood pressures in the setting of known cHTN. She reports +FMs, No LOF, no VB, no blurry vision, and RUQ pain. She plans on breast feeding. She plans on NFP with home [facility] kits for birth control.  She received her prenatal care at [facility].  Dating: By L/11 --->  Estimated Date of Delivery: 08/23/20  Sono:  @[redacted]w[redacted]d , CWD, normal anatomy, cephalic presentation, 2499g, EFW  Prenatal History/Complications:  - cHTN (no medications) with superimposed Severe Preeclampsia - Cystic fibrosis carrier - H/o Cesarean x2 - MRSA Carrier - H/o IUFD at [redacted] weeks GA - Anemia in pregnancy  Past Medical History: Past Medical History:  Diagnosis Date  . Asymptomatic bacteriuria 07/01/2016   >100,000 e.coli in initial OB urine culture.  [x ] toc mid may> mixed flora (10-25 CFU)   . Asymptomatic bacteriuria during pregnancy 07/01/2016   >100,000 e.coli in initial OB urine culture.    . Chronic hypertension 01/12/2019   Diagnosed during recent pregnancy (elevated BP on two separate occasions) 11/16/18 [redacted]w[redacted]d 145/93 12/24/18 [redacted]w[redacted]d 162/94 12/24/18 [redacted]w[redacted]d 161/94 then 146/99 This is thought to have contributed to IUFD at [redacted]w[redacted]d    Past Surgical History: Past Surgical History:  Procedure Laterality Date  . CESAREAN SECTION N/A 08/24/2013   Procedure: CESAREAN SECTION;  Surgeon: 08/26/2013, MD;  Location: [facility] ORS;  Service: Obstetrics;  Laterality: N/A;  . CESAREAN SECTION N/A 01/17/2017   Procedure: CESAREAN SECTION;  Surgeon: 01/19/2017, MD;  Location: [facility] BIRTHING SUITES;  Service: Obstetrics;  Laterality: N/A;  . DILATION AND CURETTAGE OF UTERUS  12/25/2018   For retained placenta after vaginal delivery of 17 week fetal demise - Done at [facility]  . TYMPANOPLASTY     age 60    Obstetrical History: OB History    Gravida   4   Para  2   Term  2   Preterm      AB  1   Living  2     SAB  1   IAB      Ectopic      Multiple  0   Live Births  2           Social History Social History   Socioeconomic History  . Marital status: Married    Spouse name: 9  . Number of children: Not on file  . Years of education: Not on file  . Highest education level: Not on file  Occupational History  . Occupation: [employer]  Tobacco Use  . Smoking status: Never Smoker  . Smokeless tobacco: Never Used  Vaping Use  . Vaping Use: Never used  Substance and Sexual Activity  . Alcohol use: No  . Drug use: No  . Sexual activity: Yes    Birth control/protection: None  Other Topics Concern  . Not on file  Social History Narrative  . Not on file   Social Determinants of Health   Financial Resource Strain: Not on file  Food Insecurity: Not on file  Transportation Needs: Not on file  Physical Activity: Not on file  Stress: Not on file  Social Connections: Not on file    Family History: Family History  Problem Relation Age of Onset  . Diabetes Mother   . Hearing loss Father   . Hearing  loss Paternal Uncle   . Hearing loss Paternal Grandfather     Allergies: No Known Allergies  Medications Prior to Admission  Medication Sig Dispense Refill Last Dose  . aspirin EC 81 MG tablet Take 1 tablet (81 mg total) by mouth daily. Take after 12 weeks for prevention of preeclampsia later in pregnancy (on or after 11/05) 300 tablet 2   . Blood Pressure Monitoring (BLOOD PRESSURE CUFF) MISC 1 Device by Does not apply route once a week. To be monitored weekly at home 1 each 0   . doxylamine, Sleep, (UNISOM) 25 MG tablet Take 1 tablet (25 mg total) by mouth at bedtime as needed. (Patient not taking: Reported on 08/08/2020) 30 tablet 6   . ferrous sulfate (FERROUSUL) 325 (65 FE) MG tablet Take 1 tablet (325 mg total) by mouth every other day. 30 tablet 3   . Prenatal Vit-Fe Fumarate-FA  (PRENATAL MULTIVITAMIN) TABS tablet Take 1 tablet by mouth daily.      . progesterone (PROMETRIUM) 200 MG capsule Take 2 capsules (400 mg total) by mouth daily. 120 capsule 2      Review of Systems   All systems reviewed and negative except as stated in HPI  Blood pressure 115/77, pulse 90, temperature 98 F (36.7 C), temperature source Oral, resp. rate 16, height 4\' 10"  (1.473 m), weight 71.7 kg, last menstrual period 11/17/2019, SpO2 100 %, currently breastfeeding. General appearance: alert, cooperative and appears stated age Lungs: normal WOB Heart: regular rate Abdomen: soft, non-tender Extremities: no sign of DVT Fetal monitoringBaseline: 120 bpm, Variability: Good {> 6 bpm), Accelerations: Reactive and Decelerations: Absent Uterine activity: rare contractions   Prenatal labs: ABO, Rh: --/--/A POS (05/11 2345) Antibody: NEG (05/11 2345) Rubella: 1.77 (10/27 0844) RPR: Non Reactive (03/01 0901)  HBsAg: Negative (10/27 0844)  HIV: Non Reactive (03/01 0901)  GBS: Negative/-- (04/26 1630)  2 hr Glucola wnl Genetic screening: wnl except for maternal CF carrier Anatomy [birth year]1990 wnl except for unilateral small choroid plexus cyst (now resolved)  Prenatal Transfer Tool  Maternal Diabetes: No Genetic Screening: Normal except for maternal CF carrier Maternal Ultrasounds/Referrals: Normal except for unilateral small choroid plexus cyst (now resolved) Fetal Ultrasounds or other Referrals:  None Maternal Substance Abuse:  No Significant Maternal Medications:  None Significant Maternal Lab Results: Group B Strep negative  Results for orders placed or performed during the hospital encounter of 08/14/20 (from the past 24 hour(s))  Protein / creatinine ratio, urine   Collection Time: 08/14/20 11:20 PM  Result Value Ref Range   Creatinine, Urine 71.09 mg/dL   Total Protein, Urine 42 mg/dL   Protein Creatinine Ratio 0.59 (H) 0.00 - 0.15 mg/mg[Cre]  Comprehensive metabolic panel    Collection Time: 08/14/20 11:45 PM  Result Value Ref Range   Sodium 134 (L) 135 - 145 mmol/L   Potassium 4.2 3.5 - 5.1 mmol/L   Chloride 104 98 - 111 mmol/L   CO2 20 (L) 22 - 32 mmol/L   Glucose, Bld 91 70 - 99 mg/dL   BUN 9 6 - 20 mg/dL   Creatinine, Ser 10/14/20 0.44 - 1.00 mg/dL   Calcium 9.4 8.9 - 1.95 mg/dL   Total Protein 6.6 6.5 - 8.1 g/dL   Albumin 2.8 (L) 3.5 - 5.0 g/dL   AST 20 15 - 41 U/L   ALT 15 0 - 44 U/L   Alkaline Phosphatase 193 (H) 38 - 126 U/L   Total Bilirubin 0.7 0.3 - 1.2 mg/dL   GFR,  Estimated >60 >60 mL/min   Anion gap 10 5 - 15  Type and screen   Collection Time: 08/14/20 11:45 PM  Result Value Ref Range   ABO/RH(D) A POS    Antibody Screen NEG    Sample Expiration      08/17/2020,2359 Performed at [facility], [address] [place], [place] [address]   Results for orders placed or performed during the hospital encounter of 08/14/20 (from the past 24 hour(s))  SARS CORONAVIRUS 2 (TAT 6-24 HRS) Nasopharyngeal Nasopharyngeal Swab   Collection Time: 08/14/20 10:42 AM   Specimen: Nasopharyngeal Swab  Result Value Ref Range   SARS Coronavirus 2 NEGATIVE NEGATIVE  Results for orders placed or performed during the hospital encounter of 08/14/20 (from the past 24 hour(s))  CBC   Collection Time: 08/14/20  9:49 AM  Result Value Ref Range   WBC 10.6 (H) 4.0 - 10.5 K/uL   RBC 4.56 3.87 - 5.11 MIL/uL   Hemoglobin 13.5 12.0 - 15.0 g/dL   HCT 62.1 30.8 - 65.7 %   MCV 89.5 80.0 - 100.0 fL   MCH 29.6 26.0 - 34.0 pg   MCHC 33.1 30.0 - 36.0 g/dL   RDW 84.6 (H) 96.2 - 95.2 %   Platelets 253 150 - 400 K/uL   nRBC 0.0 0.0 - 0.2 %  Type and screen [facility]   Collection Time: 08/14/20  9:53 AM  Result Value Ref Range   ABO/RH(D) A POS    Antibody Screen NEG    Sample Expiration      08/15/2020,2359 Performed at [facility], [address], [place], [place] [address]     Patient Active Problem List   Diagnosis Date Noted  .  Severe preeclampsia, third trimester 08/15/2020  . Anemia of pregnancy, third trimester 06/06/2020  . History of IUFD at 17 weeks 02/08/2019  . Chronic hypertension 01/12/2019  . Supervision of high-risk pregnancy 11/16/2018  . MRSA carrier 12/16/2016  . Previous cesarean section 08/21/2016  . Cystic fibrosis carrier 07/01/2016    Assessment/Plan:  Maurisa Tesmer is a 30 y.o. [id number] at [redacted]w[redacted]d here for concern of worsening headache, found to have worsening blood pressures and new diagnosis of superimposed severe preeclampsia.  #Repeat Cesarean  cHTN with superimposed Severe Preeclampsia: Pt with h/o Cesarean x2 (last in 2018). Presenting with worsening headache x1 day, not improved with tylenol or fioricet. Will start magnesium and prep for OR now pending labs. Pt consented as noted below.   The risks of cesarean section were discussed with the patient including but were not limited to: bleeding which may require transfusion or reoperation; infection which may require antibiotics; injury to bowel, bladder, ureters or other surrounding organs; injury to the fetus; need for additional procedures including hysterectomy in the event of a life-threatening hemorrhage; placental abnormalities wth subsequent pregnancies, incisional problems, thromboembolic phenomenon and other postoperative/anesthesia complications. The patient concurred with the proposed plan, giving informed written consent for the procedures. Patient has been NPO since 1700 on 08/14/20; she will remain NPO for procedure. Anesthesia and OR aware. Preoperative prophylactic antibiotics (ancef) and SCDs ordered on call to the OR.  To OR when ready.  #Pain: Per anesthesia #FWB: Category 1 strip #ID: GBS negative; plan for ancef prior to OR #MOF: breast #MOC: natural family planning with LH kits #Circ: desired  Sheila Oats, MD  08/15/2020, 1:12 AM

## 2020-08-15 NOTE — Op Note (Signed)
Emily Mack PROCEDURE DATE: 08/15/2020  PREOPERATIVE DIAGNOSES: Intrauterine pregnancy at [redacted]w[redacted]d weeks gestation; H/o Cesarean x2, cHTN with superimposed severe preeclampsia  POSTOPERATIVE DIAGNOSES: The same  PROCEDURE: Repeat Low Transverse Cesarean Section  SURGEON:  Dr. Scheryl Darter, MD  ASSISTANT:  Dr. Lynnda Shields, MD  ANESTHESIOLOGY TEAM: Anesthesiologist: Kaylyn Layer, MD CRNA: Trellis Paganini, CRNA  INDICATIONS: Emily Mack is a 30 y.o. [phone] at [redacted]w[redacted]d here for cesarean section secondary to the indications listed under preoperative diagnoses; please see preoperative note for further details.  The risks of surgery were discussed with the patient including but were not limited to: bleeding which may require transfusion or reoperation; infection which may require antibiotics; injury to bowel, bladder, ureters or other surrounding organs; injury to the fetus; need for additional procedures including hysterectomy in the event of a life-threatening hemorrhage; formation of adhesions; placental abnormalities wth subsequent pregnancies; incisional problems; thromboembolic phenomenon and other postoperative/anesthesia complications.  The patient concurred with the proposed plan, giving informed written consent for the procedure.    FINDINGS:  Viable female infant in cephalic presentation.  Apgars 8 and 9.  Clear amniotic fluid.  Intact placenta, three vessel cord.  Normal uterus, fallopian tubes and ovaries bilaterally. Moderate adhesions along lower uterine segment.  ANESTHESIA: Spinal INTRAVENOUS FLUIDS: 1950 ml   ESTIMATED BLOOD LOSS: 403 ml URINE OUTPUT:  100 ml SPECIMENS: Placenta sent to L&D COMPLICATIONS: None immediate  PROCEDURE IN DETAIL:  The patient preoperatively received intravenous antibiotics (ancef) and had sequential compression devices applied to her lower extremities.  She was then taken to the operating room where spinal anesthesia was administered and was  found to be adequate. She was then placed in a dorsal supine position with a leftward tilt, and prepped and draped in a sterile manner.  A foley catheter was placed into her bladder and attached to constant gravity.  After an adequate timeout was performed, a Pfannenstiel skin incision was made with scalpel on her preexisting scar and carried through to the underlying layer of fascia. The fascia was incised in the midline, and this incision was extended bilaterally using the Mayo scissors.  Kocher clamps were applied to the superior aspect of the fascial incision and the underlying rectus muscles and moderate adhesions were dissected off bluntly and sharply. A similar process was carried out on the inferior aspect of the fascial incision. The rectus muscles were separated in the midline and the peritoneum was entered bluntly. The Alexis self-retaining retractor was introduced into the abdominal cavity.  Attention was turned to the lower uterine segment where a low transverse hysterotomy was made with a scalpel and extended bilaterally bluntly.  The infant was successfully delivered vertex, the cord was clamped and cut after one minute, and the infant was handed over to the awaiting neonatology team. Uterine massage was then administered, and the placenta delivered intact with a three-vessel cord. The uterus was then cleared of clots and debris.  The hysterotomy was closed with 0 Vicryl in a running locked fashion, and a horizontal imbricating layer was also placed with 0 Vicryl.  Several figure-of-eight 0 Vicryl serosal stitches were placed to help with hemostasis. TXA was also administered. The pelvis was cleared of all clot and debris. Hemostasis was confirmed on all surfaces.  The retractor was removed.  The peritoneum was closed with a 0 Vicryl running stitch. The fascia was then closed using 0 Vicryl in a running fashion.  The subcutaneous layer was irrigated and reapproximated with 2-0 plain gut  interrupted  stitches, and 30 ml of marcaine 0.5% was injected into the incision site. The skin was closed with a 4-0 Vicryl subcuticular stitch. The patient tolerated the procedure well. Sponge, instrument and needle counts were correct x 3.  She was taken to the recovery room in stable condition.   Sheila Oats, MD OB Fellow, Faculty Practice 08/15/2020 3:34 AM

## 2020-08-15 NOTE — Transfer of Care (Signed)
Immediate Anesthesia Transfer of Care Note  Patient: Emily Mack  Procedure(s) Performed: CESAREAN SECTION (N/A )  Patient Location: PACU  Anesthesia Type:Spinal  Level of Consciousness: awake, alert  and patient cooperative  Airway & Oxygen Therapy: Patient Spontanous Breathing  Post-op Assessment: Report given to RN and Post -op Vital signs reviewed and stable  Post vital signs: Reviewed and stable  Last Vitals:  Vitals Value Taken Time  BP 129/65 08/15/20 0335  Temp 36.7 C 08/15/20 0335  Pulse 86 08/15/20 0338  Resp 17 08/15/20 0338  SpO2 96 % 08/15/20 0338  Vitals shown include unvalidated device data.  Last Pain:  Vitals:   08/15/20 0132  TempSrc: Oral  PainSc:          Complications: No complications documented.

## 2020-08-15 NOTE — Discharge Instructions (Signed)

## 2020-08-15 NOTE — Anesthesia Procedure Notes (Signed)
Spinal  Patient location during procedure: OR Start time: 08/15/2020 1:59 AM End time: 08/15/2020 2:01 AM Reason for block: surgical anesthesia Staffing Performed: anesthesiologist  Anesthesiologist: Kaylyn Layer, MD Preanesthetic Checklist Completed: patient identified, IV checked, risks and benefits discussed, monitors and equipment checked, pre-op evaluation and timeout performed Spinal Block Patient position: sitting Prep: DuraPrep and site prepped and draped Patient monitoring: heart rate, continuous pulse ox and blood pressure Approach: midline Location: L3-4 Injection technique: single-shot Needle Needle type: Pencan  Needle gauge: 24 G Needle length: 10 cm Assessment Sensory level: T4 Events: CSF return Additional Notes Risks, benefits, and alternative discussed. Patient gave consent to procedure. Prepped and draped in sitting position. Clear CSF obtained after one needle pass. Positive terminal aspiration. No pain or paraesthesias with injection. Patient tolerated procedure well. Vital signs stable. Amalia Greenhouse, MD

## 2020-08-15 NOTE — Discharge Summary (Signed)
Postpartum Discharge Summary  Date of Service updated 08/17/2020    Patient Name: Emily Mack DOB: [birth year] MRN: [id number]  Date of admission: 08/14/2020 Delivery date:08/15/2020  Delivering provider: Wood[place] Mode  Date of discharge:  08/17/2020 Admitting diagnosis: Severe preeclampsia, third trimester [O14.13] Intrauterine normal pregnancy [Z34.90] Intrauterine pregnancy: [redacted]w[redacted]d    Secondary diagnosis:  Active Problems:   Cystic fibrosis carrier   Previous cesarean section   MRSA carrier   Chronic hypertension   History of IUFD at 120weeks   Anemia of pregnancy, third trimester   Severe preeclampsia   Cesarean delivery delivered   Intrauterine normal pregnancy   Pelvic adhesive disease  Additional problems: as noted above   Discharge diagnosis: Repeat Cesarean delivery, Severe Preeclampsia                            Post partum procedures:non Augmentation: N/A Complications: None  Hospital course: Sceduled C/S   30y.o. yo [id number] admitted to the hospital 08/14/2020 for repeat cesarean section with the following indication:h/o Cesarean x2 in the setting of cHTN with superimposed severe preeclampsia.Delivery details are as follows:  Membrane Rupture Time/Date: 2:28 AM ,08/15/2020   Delivery Method:C-Section, Low Transverse  Details of operation can be found in separate operative note.  Patient had an uncomplicated postpartum course.  She is ambulating, tolerating a regular diet, passing flatus, and urinating well. Patient is discharged home in stable condition on  08/17/20        Newborn Data: Birth date:08/15/2020  Birth time:2:29 AM  Gender:Female  Living status:Living  Apgars:8 ,9  Weight:3850 g     Magnesium Sulfate received: Yes: Seizure prophylaxis BMZ received: No Rhophylac:N/A MMR:N/A T-DaP:Given prenatally offered prior to discharge Flu: Nooffered prior to discharge Transfusion:No  Physical exam  Vitals:   08/17/20 0055 08/17/20 0508  08/17/20 0926 08/17/20 1034  BP:  130/74 (!) 119/59 122/80  Pulse:  (!) 111  (!) 102  Resp:   18   Temp:   98.5 F (36.9 C)   TempSrc:   Oral   SpO2: 99%  100%   Weight:      Height:       General: alert, cooperative and no distress Lochia: appropriate Uterine Fundus: firm Incision: Healing well with no significant drainage, Dressing is clean, dry, and intact DVT Evaluation: No evidence of DVT seen on physical exam. Labs: Lab Results  Component Value Date   WBC 18.4 (H) 08/15/2020   HGB 9.3 (L) 08/15/2020   HCT 28.6 (L) 08/15/2020   MCV 90.8 08/15/2020   PLT 221 08/15/2020   CMP Latest Ref Rng & Units 08/14/2020  Glucose 70 - 99 mg/dL 91  BUN 6 - 20 mg/dL 9  Creatinine 0.44 - 1.00 mg/dL 0.66  Sodium 135 - 145 mmol/L 134(L)  Potassium 3.5 - 5.1 mmol/L 4.2  Chloride 98 - 111 mmol/L 104  CO2 22 - 32 mmol/L 20(L)  Calcium 8.9 - 10.3 mg/dL 9.4  Total Protein 6.5 - 8.1 g/dL 6.6  Total Bilirubin 0.3 - 1.2 mg/dL 0.7  Alkaline Phos 38 - 126 U/L 193(H)  AST 15 - 41 U/L 20  ALT 0 - 44 U/L 15   Edinburgh Score: Edinburgh Postnatal Depression Scale Screening Tool 02/15/2017  I have been able to laugh and see the funny side of things. 0  I have looked forward with enjoyment to things. 0  I have blamed myself unnecessarily  when things went wrong. 1  I have been anxious or worried for no good reason. 0  I have felt scared or panicky for no good reason. 0  Things have been getting on top of me. 0  I have been so unhappy that I have had difficulty sleeping. 0  I have felt sad or miserable. 0  I have been so unhappy that I have been crying. 0  The thought of harming myself has occurred to me. 0  Edinburgh Postnatal Depression Scale Total 1     After visit meds:  Allergies as of 08/17/2020   No Known Allergies     Medication List    STOP taking these medications   aspirin EC 81 MG tablet   doxylamine (Sleep) 25 MG tablet Commonly known as: UNISOM   ferrous sulfate 325  (65 FE) MG tablet Commonly known as: FerrouSul   progesterone 200 MG capsule Commonly known as: PROMETRIUM     TAKE these medications   acetaminophen 500 MG tablet Commonly known as: TYLENOL Take 1 tablet (500 mg total) by mouth every 6 (six) hours as needed for mild pain or moderate pain.   Blood Pressure Cuff Misc 1 Device by Does not apply route once a week. To be monitored weekly at home   ibuprofen 600 MG tablet Commonly known as: ADVIL Take 1 tablet (600 mg total) by mouth every 6 (six) hours.   NIFEdipine 30 MG 24 hr tablet Commonly known as: ADALAT CC Take 1 tablet (30 mg total) by mouth daily.   oxyCODONE 5 MG immediate release tablet Commonly known as: Oxy IR/ROXICODONE Take 1-2 tablets (5-10 mg total) by mouth every 6 (six) hours as needed for up to 7 days for severe pain or breakthrough pain.   prenatal multivitamin Tabs tablet Take 1 tablet by mouth daily.        Discharge home in stable condition Infant Feeding: No evidence of DVT seen on physical exam. Infant Disposition:home with mother Discharge instruction: per After Visit Summary and Postpartum booklet. Activity: Advance as tolerated. Pelvic rest for 6 weeks.  Diet: routine diet Future Appointments: Future Appointments  Date Time Provider [place]  08/22/2020 10:30 AM [facility] NURSE [facility]  09/26/2020 10:30 AM Aletha Halim, MD [facility]   Follow up Visit:  [place] [facility] Follow up in 1 week(s).   Specialty: Obstetrics and Gynecology Contact information: [place] [phone]             Message sent to [facility] by Dr. Astrid Drafts.  Please schedule this patient for a In person postpartum visit in 6 weeks with the following provider: Any provider. Additional Postpartum F/U:Incision check 1 week and BP check 1 week  High risk pregnancy complicated by: now  Cesarean x3, cHTN with superimposed severe preeclampsia, h/o IUFD, maternal CF carrier Delivery mode:  C-Section, Low Transverse  Anticipated Birth Control: natural family planning [facility] kits)  08/17/2020 Emeterio Reeve, MD

## 2020-08-16 ENCOUNTER — Inpatient Hospital Stay (HOSPITAL_COMMUNITY)
Admission: RE | Admit: 2020-08-16 | Payer: Commercial Managed Care - PPO | Source: Home / Self Care | Admitting: Obstetrics & Gynecology

## 2020-08-16 DIAGNOSIS — N736 Female pelvic peritoneal adhesions (postinfective): Secondary | ICD-10-CM

## 2020-08-16 MED ORDER — FERROUS SULFATE 325 (65 FE) MG PO TABS
325.0000 mg | ORAL_TABLET | ORAL | Status: DC
  Administered 2020-08-16: 325 mg via ORAL
  Filled 2020-08-16: qty 1

## 2020-08-16 MED ORDER — POLYETHYLENE GLYCOL 3350 17 G PO PACK
17.0000 g | PACK | Freq: Every day | ORAL | Status: DC
  Administered 2020-08-16 – 2020-08-17 (×2): 17 g via ORAL
  Filled 2020-08-16 (×2): qty 1

## 2020-08-16 NOTE — Lactation Note (Addendum)
This note was copied from a baby's chart. Lactation Consultation Note  Patient Name: Emily Mack [id number] Date: 08/16/2020 Reason for consult: Initial assessment;Early term 37-38.6wks;Infant weight loss (-7% weight loss, mom on Mag, C/S delivery.) Age:32 hours  Mom requested LC services at 40 hours postpartum. Mom is experienced at breastfeeding, she breastfeed her previous children two years each. P3, ETI female infant with -7% weight loss. Infant very sleepy had circumcision earlier today and not latch since circumcision, mom attempted to latch infant on her left breast using the cross cradle position, infant very sleepy and only latched for 3 minutes. Mom will breastfeed infant and supplement infant with donor breast milk. Infant was given 10 mls of donor breast milk using a slow flow bottle nipple. Mom shown how to use DEBP & how to disassemble, clean, & reassemble parts. Mom made aware of O/P services, breastfeeding support groups, community resources, and our phone # for post-discharge questions.  Mom's plan:  1- Mom will continue to breastfeed infant according to cues, 8 to 12 or more times within 24 hours, STS.   2- Mom will pump every 3 hours for 15 minutes on initial setting and give infant back any EBM first before offering donor breast milk. 3- After latching infant at the breast first, mom will supplement infant on Day 2 with 7-12 mls of EBM/ and or donor breast milk after each feeding to help stabilize weight loss.  Maternal Data Has patient been taught Hand Expression?: Yes Does the patient have breastfeeding experience prior to this delivery?: Yes How long did the patient breastfeed?: Mom is experienced at breastfeeding, she BF her previous 2 children for 2 years each, her 3rd child is now 36 years old.  Feeding Mother's Current Feeding Choice: Breast Milk and Donor Milk  LATCH Score Latch: Too sleepy or reluctant, no latch achieved, no sucking elicited.  Audible  Swallowing: A few with stimulation  Type of Nipple: Everted at rest and after stimulation  Comfort (Breast/Nipple): Soft / non-tender  Hold (Positioning): Assistance needed to correctly position infant at breast and maintain latch.  LATCH Score: 6   Lactation Tools Discussed/Used Tools: Pump Breast pump type: Double-Electric Breast Pump Pump Education: Setup, frequency, and cleaning;Milk Storage Reason for Pumping: To help stimulate milk supply, infant with -7% weight loss at 40 hours, mom on Mag with C/S delivery. Pumping frequency: Mom knows to pump every 3 hours for 15 minutes on inital setting. Pumped volume: 2 mL  Interventions Interventions: Breast feeding basics reviewed;Assisted with latch;Skin to skin;Breast compression;Adjust position;Support pillows;Position options;Expressed milk;DEBP;Education  Discharge Pump: Personal;DEBP WIC Program: No  Consult Status Consult Status: Follow-up Date: 08/17/20 Follow-up type: In-patient    Danelle Earthly 08/16/2020, 7:12 PM

## 2020-08-16 NOTE — Progress Notes (Signed)
Daily Postpartum Note  Admission Date: 08/14/2020 Current Date: 08/16/2020 7:44 AM  Neveyah Brecht is a 30 y.o. [id number] POD#1 s/p rLTCS @ 0200 , admitted for severe pre-eclampsia (mild range BPs with HA) superimposed on cHTN @ 38/6.  Pregnancy complicated by: Patient Active Problem List   Diagnosis Date Noted  . Severe preeclampsia 08/15/2020  . Cesarean delivery delivered 08/15/2020  . Intrauterine normal pregnancy 08/15/2020  . Anemia of pregnancy, third trimester 06/06/2020  . History of IUFD at 17 weeks 02/08/2019  . Chronic hypertension 01/12/2019  . Supervision of high-risk pregnancy 11/16/2018  . MRSA carrier 12/16/2016  . Previous cesarean section 08/21/2016  . Cystic fibrosis carrier 07/01/2016    Overnight/24hr events:  Came off mg a few hours ago  Subjective:  Feeling much better off of Mg. No ambulation, flatus yet. Foley still in place. +pain control and taking some PO. No s/s of pre-eclampsia  Objective:    Current Vital Signs 24h Vital Sign Ranges  T 97.8 F (36.6 C) Temp  Avg: 98.2 F (36.8 C)  Min: 97.8 F (36.6 C)  Max: 98.7 F (37.1 C)  BP 108/65 BP  Min: 92/61  Max: 122/72  HR (!) 101 Pulse  Avg: 92  Min: 80  Max: 109  RR 18 Resp  Avg: 16.7  Min: 16  Max: 18  SaO2 97 % Room Air SpO2  Avg: 96.3 %  Min: 92 %  Max: 98 %       24 Hour I/O Current Shift I/O  Time Ins Outs 05/12 0701 - 05/13 0700 In: 3523.3 [P.O.:960; I.V.:2563.3] Out: 3599 [Urine:3125] No intake/output data recorded.   Patient Vitals for the past 24 hrs:  BP Temp Temp src Pulse Resp SpO2  08/16/20 0500 -- -- -- -- 18 --  08/16/20 0407 -- -- -- -- 17 --  08/16/20 0303 108/65 -- -- (!) 101 17 97 %  08/16/20 0302 -- 97.8 F (36.6 C) Oral -- -- --  08/16/20 0250 -- -- -- -- 18 --  08/16/20 0140 -- -- -- -- 16 --  08/16/20 0030 -- -- -- -- 16 --  08/15/20 2330 122/72 -- -- 87 18 --  08/15/20 2328 122/72 98 F (36.7 C) Oral 89 18 --  08/15/20 2235 -- -- -- -- 17 --  08/15/20 2126  -- -- -- -- 17 --  08/15/20 2015 -- 98 F (36.7 C) Oral -- -- --  08/15/20 1947 116/76 -- Oral 85 18 97 %  08/15/20 1800 -- -- -- -- 16 --  08/15/20 1700 -- -- -- -- 16 --  08/15/20 1602 111/77 97.9 F (36.6 C) Oral 93 16 --  08/15/20 1532 (!) 100/53 -- -- 84 -- --  08/15/20 1509 112/68 -- -- (!) 102 -- --  08/15/20 1500 -- -- -- -- 16 --  08/15/20 1400 -- -- -- -- 16 --  08/15/20 1300 -- -- -- -- 17 --  08/15/20 1232 92/61 -- -- (!) 109 -- --  08/15/20 1200 -- -- -- -- 16 --  08/15/20 1131 (!) 98/52 98.5 F (36.9 C) -- 95 -- 95 %  08/15/20 1100 -- -- -- -- 16 --  08/15/20 1000 -- -- -- -- 16 --  08/15/20 0915 -- -- -- -- -- 93 %  08/15/20 0910 -- -- -- -- -- 92 %  08/15/20 0905 -- -- -- -- -- 96 %  08/15/20 0900 116/70 -- -- 80 16 96 %  08/15/20 0855 -- -- -- -- --  96 %  08/15/20 0850 -- -- -- -- -- 96 %  08/15/20 0845 -- -- -- -- -- 96 %  08/15/20 0840 -- -- -- -- -- 97 %  08/15/20 0835 -- -- -- -- -- 96 %  08/15/20 0830 -- -- -- -- -- 97 %  08/15/20 0825 -- -- -- -- -- 97 %  08/15/20 0820 -- -- -- -- -- 96 %  08/15/20 0815 -- -- -- -- -- 97 %  08/15/20 0810 -- -- -- -- -- 97 %  08/15/20 0805 -- -- -- -- -- 98 %  08/15/20 0801 114/67 98.7 F (37.1 C) Oral 87 16 97 %  08/15/20 0800 -- -- -- -- -- 97 %  08/15/20 0755 -- -- -- -- -- 97 %  08/15/20 0750 -- -- -- -- -- 97 %  08/15/20 0745 -- -- -- -- -- 97 %   UOP: >1102mL/hr  Physical exam: General: Well nourished, well developed female in no acute distress. Abdomen: soft, nttp, mild distension. Rare BS. C/d/i dressing Cardiovascular: S1, S2 normal, no murmur, rub or gallop, regular rate and rhythm GU: approximately UOP clear in bag Respiratory: CTAB Extremities: no clubbing, cyanosis or edema Skin: Warm and dry.   Medications: Current Facility-Administered Medications  Medication Dose Route Frequency Provider Last Rate Last Admin  . acetaminophen (TYLENOL) tablet 1,000 mg  1,000 mg Oral Q6H Sheila Oats,  MD   1,000 mg at 08/16/20 0313  . coconut oil  1 application Topical PRN Sheila Oats, MD      . witch hazel-glycerin (TUCKS) pad 1 application  1 application Topical PRN Goswick, Skipper Cliche, MD       And  . dibucaine (NUPERCAINAL) 1 % rectal ointment 1 application  1 application Rectal PRN Sheila Oats, MD      . diphenhydrAMINE (BENADRYL) injection 12.5 mg  12.5 mg Intravenous Q6H PRN Kaylyn Layer, MD       Or  . diphenhydrAMINE (BENADRYL) capsule 25 mg  25 mg Oral Q6H PRN Kaylyn Layer, MD      . diphenhydrAMINE (BENADRYL) capsule 25 mg  25 mg Oral Q6H PRN Sheila Oats, MD      . enoxaparin (LOVENOX) injection 40 mg  40 mg Subcutaneous Q24H Sheila Oats, MD   40 mg at 08/15/20 1808  . ferrous sulfate tablet 325 mg  325 mg Oral Lottie Mussel, MD      . labetalol (NORMODYNE) injection 20 mg  20 mg Intravenous PRN Sheila Oats, MD       And  . labetalol (NORMODYNE) injection 40 mg  40 mg Intravenous PRN Sheila Oats, MD       And  . labetalol (NORMODYNE) injection 80 mg  80 mg Intravenous PRN Sheila Oats, MD       And  . hydrALAZINE (APRESOLINE) injection 10 mg  10 mg Intravenous PRN Sheila Oats, MD      . ibuprofen (ADVIL) tablet 600 mg  600 mg Oral Q6H Goswick, Skipper Cliche, MD      . menthol-cetylpyridinium (CEPACOL) lozenge 3 mg  1 lozenge Oral Q2H PRN Sheila Oats, MD      . nalbuphine (NUBAIN) injection 5 mg  5 mg Intravenous Q4H PRN Kaylyn Layer, MD       Or  . nalbuphine (NUBAIN) injection 5 mg  5 mg Subcutaneous Q4H PRN Kaylyn Layer, MD      .  naloxone (NARCAN) injection 0.4 mg  0.4 mg Intravenous PRN Kaylyn Layer, MD       And  . sodium chloride flush (NS) 0.9 % injection 3 mL  3 mL Intravenous PRN Kaylyn Layer, MD      . naloxone HCl (NARCAN) 2 mg in dextrose 5 % 250 mL infusion  1-4 mcg/kg/hr Intravenous Continuous PRN Kaylyn Layer, MD      . NIFEdipine (PROCARDIA-XL/NIFEDICAL-XL) 24 hr tablet 30 mg  30 mg Oral Daily Hermina Staggers, MD   30 mg at 08/15/20 0937  . ondansetron (ZOFRAN) injection 4 mg  4 mg Intravenous Q8H PRN Kaylyn Layer, MD      . oxyCODONE (Oxy IR/ROXICODONE) immediate release tablet 5-10 mg  5-10 mg Oral Q4H PRN Sheila Oats, MD      . polyethylene glycol (MIRALAX / GLYCOLAX) packet 17 g  17 g Oral Daily Zeeland Bing, MD      . prenatal multivitamin tablet 1 tablet  1 tablet Oral Q1200 Sheila Oats, MD   1 tablet at 08/15/20 1230  . senna-docusate (Senokot-S) tablet 2 tablet  2 tablet Oral Daily Sheila Oats, MD      . simethicone [facility]) chewable tablet 80 mg  80 mg Oral TID PC Sheila Oats, MD   80 mg at 08/15/20 1807  . simethicone (MYLICON) chewable tablet 80 mg  80 mg Oral PRN Sheila Oats, MD      . Tdap Leda Min) injection 0.5 mL  0.5 mL Intramuscular Once Sheila Oats, MD        Labs:  Recent Labs  Lab 08/14/20 2345 08/15/20 0511 08/15/20 1539  WBC 11.6* 16.5* 18.4*  HGB 13.1 12.6 9.3*  HCT 39.5 37.8 28.6*  PLT 239 240 221    Recent Labs  Lab 08/14/20 2345  NA 134*  K 4.2  CL 104  CO2 20*  BUN 9  CREATININE 0.66  CALCIUM 9.4  PROT 6.6  BILITOT 0.7  ALKPHOS 193*  ALT 15  AST 20  GLUCOSE 91     Radiology:  No new imaging  Assessment & Plan:  Pt doing well *Postpartum: routine care, d/c foley later this morning. Desires circ. I d/w her and husband re: this and r/b and they are amenable to circ. Will try and do today. A POS. Breast. Declines BC *CV: continue procardia. Watch BPs for too low *PPx: lovenox qday *FEN/GI: regular diet, sliv *Dispo: possibly tomorrow   Cornelia Copa MD Attending Center for [facility]Faculty Practice) GYN Consult Phone: [phone] (M-F, [phone]) & [phone] (Off hours, weekends, holidays)

## 2020-08-17 MED ORDER — ACETAMINOPHEN 500 MG PO TABS: 500.0000 mg | ORAL_TABLET | Freq: Four times a day (QID) | ORAL | 0 refills | Status: AC | PRN

## 2020-08-17 MED ORDER — NIFEDIPINE ER 30 MG PO TB24: 30.0000 mg | ORAL_TABLET | Freq: Every day | ORAL | 0 refills | Status: DC

## 2020-08-17 MED ORDER — IBUPROFEN 600 MG PO TABS: 600.0000 mg | ORAL_TABLET | Freq: Four times a day (QID) | ORAL | 0 refills | Status: AC

## 2020-08-17 MED ORDER — OXYCODONE HCL 5 MG PO TABS: 5.0000 mg | ORAL_TABLET | Freq: Four times a day (QID) | ORAL | 0 refills | Status: AC | PRN

## 2020-08-17 NOTE — Lactation Note (Signed)
This note was copied from a baby's chart. Lactation Consultation Note  Patient Name: Emily Mack [id number] Date: 08/17/2020   Age:32 hours   Mom asleep.  Dad holding infant on sofa.  Dad showed LC the expressed milk mom had pumped and had in a bottle; 9 ml. Dad was reminded of storage guideline for milk.  Mom pumped at 6 am.  LC requested dad have mom call out for lactation when she wakes.    Maternal Data    Feeding Nipple Type: Extra Slow Flow  LATCH Score                    Lactation Tools Discussed/Used    Interventions    Discharge    Consult Status      [name] [facility] 08/17/2020, 8:31 AM

## 2020-08-17 NOTE — Progress Notes (Signed)
POSTPARTUM PROGRESS NOTE  POD #2  Subjective:  Lyrical Sowle is a 30 y.o. [id number] s/p repeat C-section at [redacted]w[redacted]d. Today she notes no acute complaints. She denies any problems with ambulating, voiding or po intake. Denies nausea or vomiting. She has + flatus, +BM.  Pain is well controlled.  Lochia minimal to moderate Denies fever/chills/chest pain/SOB.  no HA, no blurry vision, no RUQ pain  Objective: Blood pressure 119/84, pulse 94, temperature 98.3 F (36.8 C), temperature source Oral, resp. rate 16, height 4\' 10"  (1.473 m), weight 71.7 kg, last menstrual period 11/17/2019, SpO2 99 %, unknown if currently breastfeeding.  Physical Exam:  General: alert, cooperative and no distress Chest: no respiratory distress Heart: regular rate and rhythm Abdomen: soft, nontender, +BS Uterine Fundus: firm, appropriately tender Incision: C/D/I DVT Evaluation: No calf swelling or tenderness Extremities: no edema Skin: warm, dry  No results found for this or any previous visit (from the past 24 hour(s)).  Assessment/Plan: Brayli Klingbeil is a 30 y.o. [phone] s/p repeat C-section at [redacted]w[redacted]d POD#2 complicated by: 1) Preeclampsia with severe features -s/p Mag x 24hr -BP stable on procardia Xl 30mg  daily -asymptomatic  2) meeting postop milestones appropriately Possible discharge home pending baby's status  Contraception: natural family planning Feeding: breast  [redacted]w[redacted]d, DO Attending Obstetrician & Gynecologist, Faculty Practice Center for , [facility]

## 2020-08-17 NOTE — Lactation Note (Signed)
This note was copied from a baby's chart. Lactation Consultation Note  Patient Name: Emily Mack Date: 08/17/2020 Reason for consult: Follow-up assessment;Early term 37-38.6wks;Infant weight loss Age:32 hours   Infant cueing and squirming while swaddled in crib.  Mom was finishing pumping.  She collected 10 ml.  Infant had recently been given EBM and Donor milk, then breastfed for 10 minutes. Mom feels are breasts are filling and are heavier.  9% wt. Loss, 1 void today.  LC suggested putting infant to breast to a latch could be observed.  Mom placed infant in cradle.  Baby sucked then would come off, repeatedly.  Mom offered the other side and infant latched and sustained the latch in cradle.  Rhythmic sucking noted without swallows.  LC demonstrated massage and compression of the breast and infant swallows were heard.  Mom recognized these and LC encouraged mom use massage during the feeding and listen for swallows.  Other signs of milk transfer reviewed.    Mom denies any concerns at this time.    Plan:  Feed with cues; using good positioning to ensure a deep latch=good milk transfer. Offer EBM/DM after a feeding using guidelines provided.  Continue to pump every 3 hours in order to stimulate milk supply and give infant EBM.     Maternal Data    Feeding Mother's Current Feeding Choice: Breast Milk and Donor Milk  LATCH Score Latch: Repeated attempts needed to sustain latch, nipple held in mouth throughout feeding, stimulation needed to elicit sucking reflex.  Audible Swallowing: A few with stimulation  Type of Nipple: Everted at rest and after stimulation  Comfort (Breast/Nipple): Soft / non-tender  Hold (Positioning): Assistance needed to correctly position infant at breast and maintain latch. (mom holds in cradle; LC assisted with turning infant tummy to tummy and bringing infant closer to the breast)  LATCH Score: 7   Lactation Tools  Discussed/Used Tools: Pump Breast pump type: Double-Electric Breast Pump Reason for Pumping: stimulate supply/ collect EBM to feed to infant Pumping frequency: Encouraged every 2-3 hours Pumped volume: 9 mL  Interventions Interventions: Breast feeding basics reviewed;Assisted with latch;Skin to skin;Breast massage;Hand express;Expressed milk;DEBP;Support pillows;Breast compression  Discharge Pump: DEBP  Consult Status Consult Status: Follow-up Date: 08/18/20 Follow-up type: In-patient    [name] [facility] 08/17/2020, 10:21 AM

## 2020-08-22 ENCOUNTER — Other Ambulatory Visit: Payer: Self-pay

## 2020-08-22 ENCOUNTER — Ambulatory Visit (INDEPENDENT_AMBULATORY_CARE_PROVIDER_SITE_OTHER): Payer: Commercial Managed Care - PPO | Admitting: *Deleted

## 2020-08-22 DIAGNOSIS — I10 Essential (primary) hypertension: Secondary | ICD-10-CM

## 2020-08-22 NOTE — Progress Notes (Signed)
Subjective:  Emily Mack is a 30 y.o. female here for BP and incision check.  Hypertension ROS: taking medications as instructed, no medication side effects noted, no TIA's, no chest pain on exertion, no dyspnea on exertion and no swelling of ankles.    Objective:  BP (!) 138/92   Pulse 85   LMP 11/17/2019   Appearance alert, well appearing, and in no distress. General exam BP noted to be well controlled today in office.  Incision healing well, no redness or drainage noted.   Assessment:   Blood Pressure stable.   Plan:  Follow up as needed and or at postpartum visit.Marland Kitchen

## 2020-08-22 NOTE — Progress Notes (Signed)
Patient was assessed and managed by nursing staff during this encounter. I have reviewed the chart and agree with the documentation and plan.   Jaynie Collins, MD 08/22/2020 7:12 PM

## 2020-08-28 ENCOUNTER — Other Ambulatory Visit: Payer: Self-pay

## 2020-08-28 ENCOUNTER — Encounter (HOSPITAL_COMMUNITY): Payer: Self-pay | Admitting: Obstetrics & Gynecology

## 2020-08-28 ENCOUNTER — Inpatient Hospital Stay (HOSPITAL_COMMUNITY)
Admission: AD | Admit: 2020-08-28 | Discharge: 2020-08-28 | Disposition: A | Discharge status: Home / Self Care | Payer: Commercial Managed Care - PPO | Attending: Obstetrics & Gynecology | Admitting: Obstetrics & Gynecology

## 2020-08-28 ENCOUNTER — Telehealth: Payer: Self-pay | Admitting: Advanced Practice Midwife

## 2020-08-28 DIAGNOSIS — O99893 Other specified diseases and conditions complicating puerperium: Secondary | ICD-10-CM

## 2020-08-28 DIAGNOSIS — M545 Low back pain, unspecified: Secondary | ICD-10-CM | POA: Diagnosis not present

## 2020-08-28 DIAGNOSIS — O9089 Other complications of the puerperium, not elsewhere classified: Secondary | ICD-10-CM | POA: Diagnosis present

## 2020-08-28 DIAGNOSIS — Z79899 Other long term (current) drug therapy: Secondary | ICD-10-CM | POA: Insufficient documentation

## 2020-08-28 DIAGNOSIS — O1093 Unspecified pre-existing hypertension complicating the puerperium: Secondary | ICD-10-CM | POA: Insufficient documentation

## 2020-08-28 LAB — CBC WITH DIFFERENTIAL/PLATELET
Abs Immature Granulocytes: 0.05 10*3/uL (ref 0.00–0.07)
Basophils Absolute: 0.1 10*3/uL (ref 0.0–0.1)
Basophils Relative: 0 %
Eosinophils Absolute: 0.1 10*3/uL (ref 0.0–0.5)
Eosinophils Relative: 1 %
HCT: 30.4 % — ABNORMAL LOW (ref 36.0–46.0)
Hemoglobin: 9.9 g/dL — ABNORMAL LOW (ref 12.0–15.0)
Immature Granulocytes: 0 %
Lymphocytes Relative: 9 %
Lymphs Abs: 1.1 10*3/uL (ref 0.7–4.0)
MCH: 29.9 pg (ref 26.0–34.0)
MCHC: 32.6 g/dL (ref 30.0–36.0)
MCV: 91.8 fL (ref 80.0–100.0)
Monocytes Absolute: 0.5 10*3/uL (ref 0.1–1.0)
Monocytes Relative: 4 %
Neutro Abs: 10.8 10*3/uL — ABNORMAL HIGH (ref 1.7–7.7)
Neutrophils Relative %: 86 %
Platelets: 395 10*3/uL (ref 150–400)
RBC: 3.31 MIL/uL — ABNORMAL LOW (ref 3.87–5.11)
RDW: 15.3 % (ref 11.5–15.5)
WBC: 12.7 10*3/uL — ABNORMAL HIGH (ref 4.0–10.5)
nRBC: 0 % (ref 0.0–0.2)

## 2020-08-28 LAB — URINALYSIS, ROUTINE W REFLEX MICROSCOPIC
Bilirubin Urine: NEGATIVE
Glucose, UA: NEGATIVE mg/dL
Ketones, ur: NEGATIVE mg/dL
Nitrite: NEGATIVE
Protein, ur: NEGATIVE mg/dL
RBC / HPF: >50 RBC/hpf — ABNORMAL HIGH (ref 0–5)
Specific Gravity, Urine: 1.021 (ref 1.005–1.030)
pH: 5 (ref 5.0–8.0)

## 2020-08-28 MED ORDER — CYCLOBENZAPRINE HCL 10 MG PO TABS: 10.0000 mg | ORAL_TABLET | Freq: Two times a day (BID) | ORAL | 0 refills | Status: AC | PRN

## 2020-08-28 MED ORDER — CYCLOBENZAPRINE HCL 5 MG PO TABS
10.0000 mg | ORAL_TABLET | Freq: Once | ORAL | Status: AC
  Administered 2020-08-28: 10 mg via ORAL
  Filled 2020-08-28: qty 2

## 2020-08-28 NOTE — Telephone Encounter (Signed)
Patient called [facility] to report new onset heavy vaginal bleeding. S/p repeat cesarean 08/15/2020. Patient endorses saturating multiple pads between 10pm and 4am this morning. She endorses severe vaginal pain. She denies incisional pain. She denies abdominal tenderness, weakness, syncope.   Patient spoke with CNM at [facility]. Evaluation in MAU advised. Patient states she will present to MAU this morning. Report given to C. Druscilla Brownie, CNM, MAU Provider.  Clayton Bibles, MSN, CNM Certified Nurse Midwife, [facility] for [employer], [facility] 08/28/20 [phone]

## 2020-08-28 NOTE — MAU Note (Signed)
Presents with c/o back pain and heavy VB.  States heavy VB began this morning, states saturated a sanitary napkin in 2 hours, also reports passed 1 golf sized clot.  Since clot passed no longer having vaginal pressure. S/P Cesarean Section Aug 15, 2020.

## 2020-08-28 NOTE — MAU Provider Note (Signed)
History     CSN: [id number]  Arrival date and time: 08/28/20 1138   Event Date/Time   First Provider Initiated Contact with Patient 08/28/20 1244      Chief Complaint  Patient presents with  . Vaginal Bleeding  . Back Pain   HPI Emily Mack is a 30 y.o. [id number] postpartum from a repeat cesarean section on 5/12 who presents with back pain and vaginal bleeding. She reports from 10a-4a she was soaking pads with bleeding.. She states the bleeding slowed down but on the way to the hospital, she started having vaginal pressure. She states she stopped to use the bathroom and passed a fist sized clot. She states since then the pain and bleeding has subsided. She reports intermittent back pain that she rates a 5/10 and spotting when she wipes.  OB History    Gravida  4   Para  3   Term  3   Preterm      AB  1   Living  3     SAB  1   IAB      Ectopic      Multiple  0   Live Births  3           Past Medical History:  Diagnosis Date  . Asymptomatic bacteriuria 07/01/2016   >100,000 e.coli in initial OB urine culture.  [x ] toc mid may> mixed flora (10-25 CFU)   . Asymptomatic bacteriuria during pregnancy 07/01/2016   >100,000 e.coli in initial OB urine culture.    . Chronic hypertension 01/12/2019   Diagnosed during recent pregnancy (elevated BP on two separate occasions) 11/16/18 [redacted]w[redacted]d 145/93 12/24/18 [redacted]w[redacted]d 162/94 12/24/18 [redacted]w[redacted]d 161/94 then 146/99 This is thought to have contributed to IUFD at [redacted]w[redacted]d    Past Surgical History:  Procedure Laterality Date  . CESAREAN SECTION N/A 08/24/2013   Procedure: CESAREAN SECTION;  Surgeon: Allie Bossier, MD;  Location: [facility] ORS;  Service: Obstetrics;  Laterality: N/A;  . CESAREAN SECTION N/A 01/17/2017   Procedure: CESAREAN SECTION;  Surgeon: Allie Bossier, MD;  Location: [facility];  Service: Obstetrics;  Laterality: N/A;  . CESAREAN SECTION N/A 08/15/2020   Procedure: CESAREAN SECTION;  Surgeon: Lesly Dukes, MD;   Location: [facility] LD ORS;  Service: Obstetrics;  Laterality: N/A;  . DILATION AND CURETTAGE OF UTERUS  12/25/2018   For retained placenta after vaginal delivery of 17 week fetal demise - Done at [facility]  . TYMPANOPLASTY     age 46    Family History  Problem Relation Age of Onset  . Diabetes Mother   . Hearing loss Father   . Hearing loss Paternal Uncle   . Hearing loss Paternal Grandfather     Social History   Tobacco Use  . Smoking status: Never Smoker  . Smokeless tobacco: Never Used  Vaping Use  . Vaping Use: Never used  Substance Use Topics  . Alcohol use: No  . Drug use: No    Allergies: No Known Allergies  Medications Prior to Admission  Medication Sig Dispense Refill Last Dose  . NIFEdipine (ADALAT CC) 30 MG 24 hr tablet Take 1 tablet (30 mg total) by mouth daily. 30 tablet 0 08/28/2020 at Unknown time  . oxycodone-acetaminophen (LYNOX) 10-300 MG tablet Take 1 tablet by mouth every 4 (four) hours as needed for pain.     . Prenatal Vit-Fe Fumarate-FA (PRENATAL MULTIVITAMIN) TABS tablet Take 1 tablet by mouth daily.    08/28/2020 at Unknown  time  . acetaminophen (TYLENOL) 500 MG tablet Take 1 tablet (500 mg total) by mouth every 6 (six) hours as needed for mild pain or moderate pain. 30 tablet 0   . Blood Pressure Monitoring (BLOOD PRESSURE CUFF) MISC 1 Device by Does not apply route once a week. To be monitored weekly at home 1 each 0   . ibuprofen (ADVIL) 600 MG tablet Take 1 tablet (600 mg total) by mouth every 6 (six) hours. 30 tablet 0     Review of Systems  Constitutional: Negative.  Negative for fatigue and fever.  HENT: Negative.   Respiratory: Negative.  Negative for shortness of breath.   Cardiovascular: Negative.  Negative for chest pain.  Gastrointestinal: Negative.  Negative for abdominal pain, constipation, diarrhea, nausea and vomiting.  Genitourinary: Positive for vaginal bleeding. Negative for dysuria and vaginal discharge.  Musculoskeletal: Positive for back  pain.  Neurological: Negative.  Negative for dizziness and headaches.   Physical Exam   Blood pressure 121/74, pulse 87, temperature 98.1 F (36.7 C), temperature source Oral, resp. rate 20, height 4\' 10"  (1.473 m), weight 62.7 kg, SpO2 98 %, unknown if currently breastfeeding.  Physical Exam Vitals and nursing note reviewed.  Constitutional:      General: She is not in acute distress.    Appearance: She is well-developed.  HENT:     Head: Normocephalic.  Eyes:     Pupils: Pupils are equal, round, and reactive to light.  Cardiovascular:     Rate and Rhythm: Normal rate and regular rhythm.     Heart sounds: Normal heart sounds.  Pulmonary:     Effort: Pulmonary effort is normal. No respiratory distress.     Breath sounds: Normal breath sounds.  Abdominal:     General: Bowel sounds are normal. There is no distension.     Palpations: Abdomen is soft.     Tenderness: There is no abdominal tenderness.  Skin:    General: Skin is warm and dry.  Neurological:     Mental Status: She is alert and oriented to person, place, and time.  Psychiatric:        Behavior: Behavior normal.        Thought Content: Thought content normal.        Judgment: Judgment normal.     MAU Course  Procedures Results for orders placed or performed during the hospital encounter of 08/28/20 (from the past 24 hour(s))  Urinalysis, Routine w reflex microscopic Urine, Clean Catch     Status: Abnormal   Collection Time: 08/28/20 12:03 PM  Result Value Ref Range   Color, Urine YELLOW YELLOW   APPearance HAZY (A) CLEAR   Specific Gravity, Urine 1.021 1.005 - 1.030   pH 5.0 5.0 - 8.0   Glucose, UA NEGATIVE NEGATIVE mg/dL   Hgb urine dipstick LARGE (A) NEGATIVE   Bilirubin Urine NEGATIVE NEGATIVE   Ketones, ur NEGATIVE NEGATIVE mg/dL   Protein, ur NEGATIVE NEGATIVE mg/dL   Nitrite NEGATIVE NEGATIVE   Leukocytes,Ua SMALL (A) NEGATIVE   RBC / HPF >50 (H) 0 - 5 RBC/hpf   WBC, UA 11-20 0 - 5 WBC/hpf    Bacteria, UA RARE (A) NONE SEEN   Mucus PRESENT   CBC with Differential/Platelet     Status: Abnormal   Collection Time: 08/28/20 12:55 PM  Result Value Ref Range   WBC 12.7 (H) 4.0 - 10.5 K/uL   RBC 3.31 (L) 3.87 - 5.11 MIL/uL   Hemoglobin 9.9 (L) 12.0 -  15.0 g/dL   HCT 76.5 (L) 46.5 - 03.5 %   MCV 91.8 80.0 - 100.0 fL   MCH 29.9 26.0 - 34.0 pg   MCHC 32.6 30.0 - 36.0 g/dL   RDW 46.5 68.1 - 27.5 %   Platelets 395 150 - 400 K/uL   nRBC 0.0 0.0 - 0.2 %   Neutrophils Relative % 86 %   Neutro Abs 10.8 (H) 1.7 - 7.7 K/uL   Lymphocytes Relative 9 %   Lymphs Abs 1.1 0.7 - 4.0 K/uL   Monocytes Relative 4 %   Monocytes Absolute 0.5 0.1 - 1.0 K/uL   Eosinophils Relative 1 %   Eosinophils Absolute 0.1 0.0 - 0.5 K/uL   Basophils Relative 0 %   Basophils Absolute 0.1 0.0 - 0.1 K/uL   Immature Granulocytes 0 %   Abs Immature Granulocytes 0.05 0.00 - 0.07 K/uL   MDM CBC with Diff UA Flexeril PO- patient reports improvement of pain  Assessment and Plan   1. Postpartum state   2. Acute low back pain without sciatica, unspecified back pain laterality    -Discharge home in stable condition -Rx for flexeril sent to patient's pharmacy -Vaginal bleeding precautions discussed -Patient advised to follow-up with OB as scheduled for postpartum care. -Patient may return to MAU as needed or if her condition were to change or worsen   Rolm Bookbinder CNM 08/28/2020, 12:44 PM

## 2020-09-26 ENCOUNTER — Ambulatory Visit (INDEPENDENT_AMBULATORY_CARE_PROVIDER_SITE_OTHER): Payer: Commercial Managed Care - PPO | Admitting: Obstetrics and Gynecology

## 2020-09-26 ENCOUNTER — Ambulatory Visit: Payer: Commercial Managed Care - PPO | Admitting: Obstetrics and Gynecology

## 2020-09-26 ENCOUNTER — Other Ambulatory Visit: Payer: Self-pay

## 2020-09-26 MED ORDER — SLYND 4 MG PO TABS: 1.0000 | ORAL_TABLET | Freq: Every day | ORAL | 3 refills | Status: AC

## 2020-09-26 MED ORDER — NIFEDIPINE ER 30 MG PO TB24: 30.0000 mg | ORAL_TABLET | Freq: Every day | ORAL | 3 refills | Status: AC

## 2020-09-26 MED ORDER — SLYND 4 MG PO TABS: 1.0000 | ORAL_TABLET | Freq: Every day | ORAL | 3 refills | Status: DC

## 2020-09-26 NOTE — Progress Notes (Signed)
    Post Partum Visit Note  Emily Mack is a 30 y.o. [phone] who presents for a postpartum visit. She is s/p a 5/12 rpt c/s at 38wks for severe pre-eclampsia superimposed on cHTN. Anesthesia: spinal. She was discharged to home on PPD#2.  Baby is doing well. Baby is feeding by breast. Bleeding staining only. Bowel function is normal. Bladder function is normal. Patient is not sexually active. Contraception method is  undecided . Postpartum depression screening: negative.  Neg pap and hpv 11/2018    Edinburgh Postnatal Depression Scale - 09/26/20 1545       Edinburgh Postnatal Depression Scale:  In the Past 7 Days   I have been able to laugh and see the funny side of things. 0    I have looked forward with enjoyment to things. 0    I have blamed myself unnecessarily when things went wrong. 0    I have been anxious or worried for no good reason. 0    I have felt scared or panicky for no good reason. 0    Things have been getting on top of me. 1    I have been so unhappy that I have had difficulty sleeping. 0    I have felt sad or miserable. 0    I have been so unhappy that I have been crying. 0    The thought of harming myself has occurred to me. 0    Edinburgh Postnatal Depression Scale Total 1             Health Maintenance Due  Topic Date Due   COVID-19 Vaccine (1) Never done    Review of Systems Pertinent items noted in HPI and remainder of comprehensive ROS otherwise negative.  Objective:  BP (!) 159/84   Pulse 71   Wt 134 lb (60.8 kg)   Breastfeeding Yes   BMI 28.01 kg/m    NAD Abdomen: +BS, soft, nttp, nd, c/d/I incision  Assessment:   Norma postpartum exam.   Plan:   *PP: routine care. Patient would like to do OCPs. Slynd sent in *cHTN: patient was given procardia xl 30 qday #30 on d/c and finished those and so hasn't been on anything after she ran out; she states that she does not have a PCP and was on no meds or her HTN before pregnancy. I told  her that technically we need 12wks out to see if this is cHTN vs from the severe pre-eclampsia, but she still needs to be on meds. Procardia refilled and several month supply given. I offered to set her up with a PCP but she declines and states she will; she has [employer].   RTC 2 weeks for BP check   [place] Bing, MD [facility] [employer], [facility]

## 2020-09-27 ENCOUNTER — Encounter: Payer: Self-pay | Admitting: Obstetrics and Gynecology

## 2020-10-10 ENCOUNTER — Other Ambulatory Visit: Payer: Self-pay

## 2020-10-10 ENCOUNTER — Ambulatory Visit (INDEPENDENT_AMBULATORY_CARE_PROVIDER_SITE_OTHER): Payer: Commercial Managed Care - PPO | Admitting: *Deleted

## 2020-10-10 DIAGNOSIS — I10 Essential (primary) hypertension: Secondary | ICD-10-CM

## 2020-10-10 NOTE — Progress Notes (Signed)
Subjective:  Emily Mack is a 30 y.o. female here for BP check.   Hypertension ROS: taking medications as instructed, no medication side effects noted, no TIA's, no chest pain on exertion, no dyspnea on exertion, and no swelling of ankles.    Objective:  BP 137/80   Pulse 87   Appearance alert, well appearing, and in no distress. General exam BP noted to be well controlled today in office.    Assessment:   Blood Pressure stable.   Plan:  As needed and or next follow up .

## 2020-10-10 NOTE — Progress Notes (Signed)
Patient was assessed and managed by nursing staff during this encounter. I have reviewed the chart and agree with the documentation and plan.   Jaynie Collins, MD 10/10/2020 4:06 PM
